# Patient Record
Sex: Male | Born: 1955 | Race: White | Hispanic: No | Marital: Single | Smoking: Never smoker
Health system: Southern US, Community
[De-identification: ages and names within clinical notes are randomized; demographics above are authoritative.]

## PROBLEM LIST (undated history)

## (undated) DIAGNOSIS — I10 Essential (primary) hypertension: Secondary | ICD-10-CM

## (undated) DIAGNOSIS — E785 Hyperlipidemia, unspecified: Secondary | ICD-10-CM

---

## 2010-02-01 ENCOUNTER — Ambulatory Visit: Payer: Self-pay | Admitting: Gastroenterology

## 2011-07-18 ENCOUNTER — Ambulatory Visit: Payer: Self-pay | Admitting: Internal Medicine

## 2011-07-24 ENCOUNTER — Ambulatory Visit: Payer: Self-pay | Admitting: Internal Medicine

## 2011-07-24 LAB — CBC CANCER CENTER
Basophil %: 1 %
Comment - H1-Com2: NORMAL
Eosinophil #: 0.7 x10 3/mm (ref 0.0–0.7)
Eosinophil %: 5.4 %
Lymphocyte %: 17.5 %
Lymphocytes: 17 %
MCHC: 33.5 g/dL (ref 32.0–36.0)
Monocyte %: 7.8 %
Monocytes: 10 %
Neutrophil %: 68.3 %
Platelet: 443 x10 3/mm — ABNORMAL HIGH (ref 150–440)
RDW: 13.3 % (ref 11.5–14.5)
WBC: 12 x10 3/mm — ABNORMAL HIGH (ref 3.8–10.6)

## 2011-07-24 LAB — HEPATIC FUNCTION PANEL A (ARMC)
Albumin: 4 g/dL (ref 3.4–5.0)
Alkaline Phosphatase: 59 U/L (ref 50–136)
Bilirubin,Total: 0.4 mg/dL (ref 0.2–1.0)

## 2011-07-24 LAB — CREATININE, SERUM
EGFR (African American): >60
EGFR (Non-African Amer.): >60

## 2011-07-26 ENCOUNTER — Ambulatory Visit: Payer: Self-pay | Admitting: Internal Medicine

## 2011-08-23 ENCOUNTER — Ambulatory Visit: Payer: Self-pay | Admitting: Internal Medicine

## 2011-09-22 ENCOUNTER — Inpatient Hospital Stay (HOSPITAL_BASED_OUTPATIENT_CLINIC_OR_DEPARTMENT_OTHER)
Admission: EM | Admit: 2011-09-22 | Discharge: 2011-09-25 | DRG: 502 | Disposition: A | Discharge status: Rehabilitation Facility | Payer: Worker's Compensation | Attending: Internal Medicine | Admitting: Internal Medicine

## 2011-09-22 ENCOUNTER — Encounter (HOSPITAL_BASED_OUTPATIENT_CLINIC_OR_DEPARTMENT_OTHER): Payer: Self-pay | Admitting: *Deleted

## 2011-09-22 DIAGNOSIS — D72829 Elevated white blood cell count, unspecified: Secondary | ICD-10-CM | POA: Diagnosis present

## 2011-09-22 DIAGNOSIS — E785 Hyperlipidemia, unspecified: Secondary | ICD-10-CM | POA: Diagnosis present

## 2011-09-22 DIAGNOSIS — E119 Type 2 diabetes mellitus without complications: Secondary | ICD-10-CM | POA: Diagnosis present

## 2011-09-22 DIAGNOSIS — E669 Obesity, unspecified: Secondary | ICD-10-CM | POA: Diagnosis present

## 2011-09-22 DIAGNOSIS — I10 Essential (primary) hypertension: Secondary | ICD-10-CM | POA: Diagnosis present

## 2011-09-22 DIAGNOSIS — Y9301 Activity, walking, marching and hiking: Secondary | ICD-10-CM

## 2011-09-22 DIAGNOSIS — S76119A Strain of unspecified quadriceps muscle, fascia and tendon, initial encounter: Secondary | ICD-10-CM

## 2011-09-22 DIAGNOSIS — Z87891 Personal history of nicotine dependence: Secondary | ICD-10-CM

## 2011-09-22 DIAGNOSIS — Z79899 Other long term (current) drug therapy: Secondary | ICD-10-CM

## 2011-09-22 DIAGNOSIS — W108XXA Fall (on) (from) other stairs and steps, initial encounter: Secondary | ICD-10-CM | POA: Diagnosis present

## 2011-09-22 DIAGNOSIS — S838X9A Sprain of other specified parts of unspecified knee, initial encounter: Principal | ICD-10-CM | POA: Diagnosis present

## 2011-09-22 DIAGNOSIS — S86819A Strain of other muscle(s) and tendon(s) at lower leg level, unspecified leg, initial encounter: Principal | ICD-10-CM | POA: Diagnosis present

## 2011-09-22 HISTORY — DX: Essential (primary) hypertension: I10

## 2011-09-22 NOTE — ED Notes (Signed)
Pt states he was going down the steps at work and his right leg gave out. "Felt pop above knee" Tried to catch himself and the same thing happened on the left side. Brought to ED via EMS.

## 2011-09-22 NOTE — ED Notes (Signed)
Pt was at work slipped and fell onto concrete onto both knees  states that he felt both knees pop

## 2011-09-23 ENCOUNTER — Encounter (HOSPITAL_COMMUNITY): Payer: Self-pay | Admitting: *Deleted

## 2011-09-23 ENCOUNTER — Encounter (HOSPITAL_COMMUNITY)
Admission: EM | Disposition: A | Discharge status: Rehabilitation Facility | Payer: Self-pay | Source: Home / Self Care | Attending: Internal Medicine

## 2011-09-23 ENCOUNTER — Emergency Department (INDEPENDENT_AMBULATORY_CARE_PROVIDER_SITE_OTHER): Payer: Worker's Compensation

## 2011-09-23 ENCOUNTER — Encounter (HOSPITAL_COMMUNITY): Payer: Self-pay | Admitting: Certified Registered"

## 2011-09-23 ENCOUNTER — Inpatient Hospital Stay (HOSPITAL_COMMUNITY): Payer: Worker's Compensation | Admitting: Certified Registered"

## 2011-09-23 DIAGNOSIS — R937 Abnormal findings on diagnostic imaging of other parts of musculoskeletal system: Secondary | ICD-10-CM

## 2011-09-23 DIAGNOSIS — S8000XA Contusion of unspecified knee, initial encounter: Secondary | ICD-10-CM

## 2011-09-23 DIAGNOSIS — S99929A Unspecified injury of unspecified foot, initial encounter: Secondary | ICD-10-CM

## 2011-09-23 DIAGNOSIS — E785 Hyperlipidemia, unspecified: Secondary | ICD-10-CM | POA: Diagnosis present

## 2011-09-23 DIAGNOSIS — E119 Type 2 diabetes mellitus without complications: Secondary | ICD-10-CM | POA: Diagnosis present

## 2011-09-23 DIAGNOSIS — W19XXXA Unspecified fall, initial encounter: Secondary | ICD-10-CM

## 2011-09-23 DIAGNOSIS — I10 Essential (primary) hypertension: Secondary | ICD-10-CM | POA: Diagnosis present

## 2011-09-23 DIAGNOSIS — M25569 Pain in unspecified knee: Secondary | ICD-10-CM

## 2011-09-23 DIAGNOSIS — S76119A Strain of unspecified quadriceps muscle, fascia and tendon, initial encounter: Secondary | ICD-10-CM

## 2011-09-23 HISTORY — PX: QUADRICEPS TENDON REPAIR: SHX756

## 2011-09-23 LAB — GLUCOSE, CAPILLARY
Glucose-Capillary: 171 mg/dL — ABNORMAL HIGH (ref 70–99)
Glucose-Capillary: 171 mg/dL — ABNORMAL HIGH (ref 70–99)
Glucose-Capillary: 141 mg/dL — ABNORMAL HIGH (ref 70–99)

## 2011-09-23 LAB — CBC
Hemoglobin: 13.6 g/dL (ref 13.0–17.0)
Hemoglobin: 13.7 g/dL (ref 13.0–17.0)
MCHC: 33.5 g/dL (ref 30.0–36.0)
Platelets: 377 10*3/uL (ref 150–400)
RBC: 4.45 MIL/uL (ref 4.22–5.81)
RBC: 4.52 MIL/uL (ref 4.22–5.81)
WBC: 18.8 10*3/uL — ABNORMAL HIGH (ref 4.0–10.5)
WBC: 12.8 10*3/uL — ABNORMAL HIGH (ref 4.0–10.5)

## 2011-09-23 LAB — BASIC METABOLIC PANEL
Chloride: 100 mEq/L (ref 96–112)
Creatinine, Ser: 0.8 mg/dL (ref 0.50–1.35)
GFR calc Af Amer: >90 mL/min (ref >90)
Potassium: 3.9 mEq/L (ref 3.5–5.1)
Sodium: 137 mEq/L (ref 135–145)

## 2011-09-23 LAB — DIFFERENTIAL
Basophils Relative: 0 % (ref 0–1)
Eosinophils Relative: 1 % (ref 0–5)
Lymphocytes Relative: 12 % (ref 12–46)
Monocytes Relative: 8 % (ref 3–12)
Neutrophils Relative %: 79 % — ABNORMAL HIGH (ref 43–77)

## 2011-09-23 LAB — CREATININE, SERUM
Creatinine, Ser: 0.78 mg/dL (ref 0.50–1.35)
GFR calc Af Amer: >90 mL/min (ref >90)
GFR calc non Af Amer: >90 mL/min (ref >90)

## 2011-09-23 SURGERY — REPAIR, TENDON, QUADRICEPS
Anesthesia: General | Site: Leg Upper | Laterality: Bilateral | Wound class: Clean

## 2011-09-23 MED ORDER — SIMVASTATIN 20 MG PO TABS
20.0000 mg | ORAL_TABLET | Freq: Every day | ORAL | Status: DC
  Administered 2011-09-24: 20 mg via ORAL
  Filled 2011-09-23 (×3): qty 1

## 2011-09-23 MED ORDER — ALBUTEROL SULFATE HFA 108 (90 BASE) MCG/ACT IN AERS
INHALATION_SPRAY | RESPIRATORY_TRACT | Status: DC | PRN
  Administered 2011-09-23: 2 via RESPIRATORY_TRACT

## 2011-09-23 MED ORDER — INSULIN ASPART 100 UNIT/ML ~~LOC~~ SOLN
0.0000 [IU] | SUBCUTANEOUS | Status: DC
  Administered 2011-09-23 – 2011-09-24 (×5): 3 [IU] via SUBCUTANEOUS

## 2011-09-23 MED ORDER — HYDROMORPHONE HCL PF 1 MG/ML IJ SOLN
0.5000 mg | INTRAMUSCULAR | Status: AC | PRN
  Administered 2011-09-23 (×2): 0.5 mg via INTRAVENOUS

## 2011-09-23 MED ORDER — ONDANSETRON HCL 4 MG/2ML IJ SOLN
INTRAMUSCULAR | Status: AC
  Filled 2011-09-23: qty 2

## 2011-09-23 MED ORDER — HYDROMORPHONE HCL PF 1 MG/ML IJ SOLN
INTRAMUSCULAR | Status: AC
  Administered 2011-09-23: 0.5 mg via INTRAVENOUS
  Filled 2011-09-23: qty 1

## 2011-09-23 MED ORDER — MORPHINE SULFATE 4 MG/ML IJ SOLN
4.0000 mg | Freq: Once | INTRAMUSCULAR | Status: AC
  Administered 2011-09-23: 4 mg via INTRAVENOUS

## 2011-09-23 MED ORDER — OXYCODONE-ACETAMINOPHEN 5-325 MG PO TABS: 1.0000 | ORAL_TABLET | ORAL | Status: DC | PRN

## 2011-09-23 MED ORDER — HYDROMORPHONE HCL PF 1 MG/ML IJ SOLN
0.5000 mg | INTRAMUSCULAR | Status: DC | PRN
  Administered 2011-09-23 – 2011-09-24 (×4): 1 mg via INTRAVENOUS
  Filled 2011-09-23 (×5): qty 1

## 2011-09-23 MED ORDER — METHOCARBAMOL 100 MG/ML IJ SOLN
500.0000 mg | Freq: Four times a day (QID) | INTRAVENOUS | Status: DC | PRN
  Filled 2011-09-23: qty 5

## 2011-09-23 MED ORDER — DEXTROSE-NACL 5-0.9 % IV SOLN
INTRAVENOUS | Status: DC
  Administered 2011-09-23: 1000 mL via INTRAVENOUS

## 2011-09-23 MED ORDER — OXYCODONE HCL 5 MG PO TABS
5.0000 mg | ORAL_TABLET | ORAL | Status: DC | PRN
  Administered 2011-09-24 – 2011-09-25 (×3): 5 mg via ORAL
  Filled 2011-09-23 (×3): qty 1

## 2011-09-23 MED ORDER — MORPHINE SULFATE 4 MG/ML IJ SOLN: 0.0500 mg/kg | INTRAMUSCULAR | Status: DC | PRN

## 2011-09-23 MED ORDER — FENTANYL CITRATE 0.05 MG/ML IJ SOLN
INTRAMUSCULAR | Status: DC | PRN
  Administered 2011-09-23: 150 ug via INTRAVENOUS
  Administered 2011-09-23: 50 ug via INTRAVENOUS

## 2011-09-23 MED ORDER — METHOCARBAMOL 500 MG PO TABS
500.0000 mg | ORAL_TABLET | Freq: Four times a day (QID) | ORAL | Status: DC | PRN
  Filled 2011-09-23: qty 1

## 2011-09-23 MED ORDER — CHLORHEXIDINE GLUCONATE 4 % EX LIQD
60.0000 mL | Freq: Once | CUTANEOUS | Status: AC
  Administered 2011-09-23: 4 via TOPICAL
  Filled 2011-09-23: qty 60

## 2011-09-23 MED ORDER — HYDROMORPHONE HCL PF 1 MG/ML IJ SOLN
0.2500 mg | INTRAMUSCULAR | Status: DC | PRN
  Administered 2011-09-23 (×4): 0.5 mg via INTRAVENOUS

## 2011-09-23 MED ORDER — LISINOPRIL-HYDROCHLOROTHIAZIDE 20-25 MG PO TABS: 1.0000 | ORAL_TABLET | Freq: Every day | ORAL | Status: DC

## 2011-09-23 MED ORDER — ONDANSETRON HCL 4 MG/2ML IJ SOLN
INTRAMUSCULAR | Status: DC | PRN
  Administered 2011-09-23: 4 mg via INTRAVENOUS

## 2011-09-23 MED ORDER — LISINOPRIL 10 MG PO TABS
10.0000 mg | ORAL_TABLET | Freq: Two times a day (BID) | ORAL | Status: DC
  Administered 2011-09-23: 10 mg via ORAL
  Filled 2011-09-23 (×2): qty 1

## 2011-09-23 MED ORDER — INSULIN ASPART 100 UNIT/ML ~~LOC~~ SOLN
0.0000 [IU] | SUBCUTANEOUS | Status: DC
  Administered 2011-09-23: 2 [IU] via SUBCUTANEOUS
  Administered 2011-09-23: 3 [IU] via SUBCUTANEOUS

## 2011-09-23 MED ORDER — SUCCINYLCHOLINE CHLORIDE 20 MG/ML IJ SOLN
INTRAMUSCULAR | Status: DC | PRN
  Administered 2011-09-23 (×2): 100 mg via INTRAVENOUS

## 2011-09-23 MED ORDER — METFORMIN HCL 500 MG PO TABS: 1000.0000 mg | ORAL_TABLET | Freq: Two times a day (BID) | ORAL | Status: DC

## 2011-09-23 MED ORDER — HEPARIN SODIUM (PORCINE) 5000 UNIT/ML IJ SOLN
5000.0000 [IU] | Freq: Three times a day (TID) | INTRAMUSCULAR | Status: DC
  Filled 2011-09-23 (×4): qty 1

## 2011-09-23 MED ORDER — SIMVASTATIN 10 MG PO TABS
10.0000 mg | ORAL_TABLET | Freq: Every day | ORAL | Status: DC
Start: 2011-09-23 — End: 2011-09-23
  Administered 2011-09-23: 23:00:00 via ORAL
  Filled 2011-09-23: qty 1

## 2011-09-23 MED ORDER — MORPHINE SULFATE 4 MG/ML IJ SOLN
INTRAMUSCULAR | Status: AC
  Filled 2011-09-23: qty 1

## 2011-09-23 MED ORDER — ONDANSETRON HCL 4 MG/2ML IJ SOLN: 4.0000 mg | Freq: Four times a day (QID) | INTRAMUSCULAR | Status: DC | PRN

## 2011-09-23 MED ORDER — METOCLOPRAMIDE HCL 5 MG/ML IJ SOLN
5.0000 mg | Freq: Three times a day (TID) | INTRAMUSCULAR | Status: DC | PRN
  Filled 2011-09-23: qty 2

## 2011-09-23 MED ORDER — 0.9 % SODIUM CHLORIDE (POUR BTL) OPTIME
TOPICAL | Status: DC | PRN
  Administered 2011-09-23: 1000 mL

## 2011-09-23 MED ORDER — MIDAZOLAM HCL 5 MG/5ML IJ SOLN
INTRAMUSCULAR | Status: DC | PRN
  Administered 2011-09-23: 2 mg via INTRAVENOUS

## 2011-09-23 MED ORDER — LIDOCAINE HCL (CARDIAC) 20 MG/ML IV SOLN
INTRAVENOUS | Status: DC | PRN
  Administered 2011-09-23: 100 mg via INTRAVENOUS

## 2011-09-23 MED ORDER — OXYCODONE-ACETAMINOPHEN 5-325 MG PO TABS
1.0000 | ORAL_TABLET | Freq: Once | ORAL | Status: AC
  Administered 2011-09-23: 1 via ORAL
  Filled 2011-09-23: qty 1

## 2011-09-23 MED ORDER — DOCUSATE SODIUM 100 MG PO CAPS
100.0000 mg | ORAL_CAPSULE | Freq: Two times a day (BID) | ORAL | Status: DC
  Administered 2011-09-23 – 2011-09-25 (×4): 100 mg via ORAL
  Filled 2011-09-23 (×6): qty 1

## 2011-09-23 MED ORDER — HYDROMORPHONE HCL PF 1 MG/ML IJ SOLN
1.0000 mg | INTRAMUSCULAR | Status: DC | PRN
  Administered 2011-09-23 (×3): 1 mg via INTRAVENOUS
  Filled 2011-09-23 (×3): qty 1

## 2011-09-23 MED ORDER — CEFAZOLIN SODIUM-DEXTROSE 2-3 GM-% IV SOLR
2.0000 g | Freq: Four times a day (QID) | INTRAVENOUS | Status: AC
  Administered 2011-09-23 – 2011-09-24 (×3): 2 g via INTRAVENOUS
  Filled 2011-09-23 (×3): qty 50

## 2011-09-23 MED ORDER — LACTATED RINGERS IV SOLN
INTRAVENOUS | Status: DC | PRN
  Administered 2011-09-23: 18:00:00 via INTRAVENOUS

## 2011-09-23 MED ORDER — ONDANSETRON HCL 4 MG/2ML IJ SOLN: 4.0000 mg | Freq: Once | INTRAMUSCULAR | Status: DC | PRN

## 2011-09-23 MED ORDER — ONDANSETRON HCL 4 MG/2ML IJ SOLN
4.0000 mg | Freq: Once | INTRAMUSCULAR | Status: AC
  Administered 2011-09-23: 4 mg via INTRAVENOUS

## 2011-09-23 MED ORDER — SODIUM CHLORIDE 0.9 % IV SOLN
INTRAVENOUS | Status: DC
  Administered 2011-09-23: 23:00:00 via INTRAVENOUS

## 2011-09-23 MED ORDER — HYDROCHLOROTHIAZIDE 25 MG PO TABS
25.0000 mg | ORAL_TABLET | Freq: Every day | ORAL | Status: DC
  Administered 2011-09-24 – 2011-09-25 (×2): 25 mg via ORAL
  Filled 2011-09-23 (×2): qty 1

## 2011-09-23 MED ORDER — LISINOPRIL 20 MG PO TABS
20.0000 mg | ORAL_TABLET | Freq: Every day | ORAL | Status: DC
  Administered 2011-09-24 – 2011-09-25 (×2): 20 mg via ORAL
  Filled 2011-09-23 (×2): qty 1

## 2011-09-23 MED ORDER — CEFAZOLIN SODIUM-DEXTROSE 2-3 GM-% IV SOLR
2.0000 g | INTRAVENOUS | Status: AC
  Administered 2011-09-23: 2 g via INTRAVENOUS
  Filled 2011-09-23: qty 50

## 2011-09-23 MED ORDER — PROPOFOL 10 MG/ML IV EMUL
INTRAVENOUS | Status: DC | PRN
  Administered 2011-09-23: 120 mg via INTRAVENOUS

## 2011-09-23 MED ORDER — METHOCARBAMOL 100 MG/ML IJ SOLN
500.0000 mg | INTRAVENOUS | Status: AC
  Administered 2011-09-23: 500 mg via INTRAVENOUS
  Filled 2011-09-23: qty 5

## 2011-09-23 MED ORDER — METOCLOPRAMIDE HCL 10 MG PO TABS: 5.0000 mg | ORAL_TABLET | Freq: Three times a day (TID) | ORAL | Status: DC | PRN

## 2011-09-23 MED ORDER — GLIMEPIRIDE 4 MG PO TABS
4.0000 mg | ORAL_TABLET | Freq: Two times a day (BID) | ORAL | Status: DC
  Administered 2011-09-24: 4 mg via ORAL
  Filled 2011-09-23 (×3): qty 1

## 2011-09-23 MED ORDER — ONDANSETRON HCL 4 MG PO TABS: 4.0000 mg | ORAL_TABLET | Freq: Four times a day (QID) | ORAL | Status: DC | PRN

## 2011-09-23 MED ORDER — METFORMIN HCL 500 MG PO TABS
1000.0000 mg | ORAL_TABLET | Freq: Every day | ORAL | Status: DC
  Filled 2011-09-23 (×2): qty 2

## 2011-09-23 MED ORDER — HYDROCODONE-ACETAMINOPHEN 5-325 MG PO TABS
1.0000 | ORAL_TABLET | ORAL | Status: DC | PRN
Start: 2011-09-23 — End: 2011-09-25

## 2011-09-23 SURGICAL SUPPLY — 47 items
BANDAGE ELASTIC 4 VELCRO ST LF (GAUZE/BANDAGES/DRESSINGS) ×2 IMPLANT
BANDAGE ELASTIC 6 VELCRO ST LF (GAUZE/BANDAGES/DRESSINGS) ×2 IMPLANT
BANDAGE GAUZE ELAST BULKY 4 IN (GAUZE/BANDAGES/DRESSINGS) ×2 IMPLANT
BLADE SURG 10 STRL SS (BLADE) ×2 IMPLANT
BNDG COHESIVE 4X5 TAN STRL (GAUZE/BANDAGES/DRESSINGS) ×2 IMPLANT
BNDG COHESIVE 6X5 TAN STRL LF (GAUZE/BANDAGES/DRESSINGS) ×2 IMPLANT
CLOTH BEACON ORANGE TIMEOUT ST (SAFETY) ×2 IMPLANT
COVER MAYO STAND STRL (DRAPES) ×2 IMPLANT
CUFF TOURNIQUET SINGLE 24IN (TOURNIQUET CUFF) IMPLANT
CUFF TOURNIQUET SINGLE 34IN LL (TOURNIQUET CUFF) IMPLANT
CUFF TOURNIQUET SINGLE 44IN (TOURNIQUET CUFF) IMPLANT
DRAPE INCISE IOBAN 66X45 STRL (DRAPES) ×2 IMPLANT
DRAPE U-SHAPE 47X51 STRL (DRAPES) ×2 IMPLANT
DRSG ADAPTIC 3X8 NADH LF (GAUZE/BANDAGES/DRESSINGS) ×2 IMPLANT
DRSG PAD ABDOMINAL 8X10 ST (GAUZE/BANDAGES/DRESSINGS) ×2 IMPLANT
DURAPREP 26ML APPLICATOR (WOUND CARE) ×2 IMPLANT
ELECT REM PT RETURN 9FT ADLT (ELECTROSURGICAL) ×2
ELECTRODE REM PT RTRN 9FT ADLT (ELECTROSURGICAL) ×1 IMPLANT
GLOVE BIOGEL PI IND STRL 9 (GLOVE) ×1 IMPLANT
GLOVE BIOGEL PI INDICATOR 9 (GLOVE) ×1
GLOVE SURG ORTHO 9.0 STRL STRW (GLOVE) ×2 IMPLANT
GOWN PREVENTION PLUS XLARGE (GOWN DISPOSABLE) ×2 IMPLANT
KIT BASIN OR (CUSTOM PROCEDURE TRAY) ×2 IMPLANT
KIT ROOM TURNOVER OR (KITS) ×2 IMPLANT
MANIFOLD NEPTUNE II (INSTRUMENTS) ×2 IMPLANT
NEEDLE STRAIGHT KEITH (NEEDLE) IMPLANT
NS IRRIG 1000ML POUR BTL (IV SOLUTION) ×2 IMPLANT
PACK ORTHO EXTREMITY (CUSTOM PROCEDURE TRAY) ×2 IMPLANT
PAD ARMBOARD 7.5X6 YLW CONV (MISCELLANEOUS) ×4 IMPLANT
PAD CAST 4YDX4 CTTN HI CHSV (CAST SUPPLIES) ×1 IMPLANT
PADDING CAST COTTON 4X4 STRL (CAST SUPPLIES) ×1
PADDING CAST COTTON 6X4 STRL (CAST SUPPLIES) ×2 IMPLANT
PASSER SUT SWANSON 36MM LOOP (INSTRUMENTS) ×2 IMPLANT
PENCIL BUTTON HOLSTER BLD 10FT (ELECTRODE) ×2 IMPLANT
SPONGE GAUZE 4X4 12PLY (GAUZE/BANDAGES/DRESSINGS) ×2 IMPLANT
SPONGE LAP 18X18 X RAY DECT (DISPOSABLE) ×4 IMPLANT
STAPLER VISISTAT 35W (STAPLE) ×2 IMPLANT
STOCKINETTE IMPERVIOUS 9X36 MD (GAUZE/BANDAGES/DRESSINGS) ×4 IMPLANT
SUT ETHIBOND 2 OS 4 DA (SUTURE) ×8 IMPLANT
SUT ETHIBOND NAB BRD #0 18IN (SUTURE) ×2 IMPLANT
SUT VIC AB 0 CTB1 27 (SUTURE) ×4 IMPLANT
SUT VIC AB 2-0 CTB1 (SUTURE) ×4 IMPLANT
TOWEL OR 17X24 6PK STRL BLUE (TOWEL DISPOSABLE) ×2 IMPLANT
TOWEL OR 17X26 10 PK STRL BLUE (TOWEL DISPOSABLE) ×2 IMPLANT
TUBE CONNECTING 12X1/4 (SUCTIONS) ×2 IMPLANT
WATER STERILE IRR 1000ML POUR (IV SOLUTION) ×2 IMPLANT
YANKAUER SUCT BULB TIP NO VENT (SUCTIONS) ×2 IMPLANT

## 2011-09-23 NOTE — Progress Notes (Signed)
Patient arrived from [facility] med center via stretcher, oriented to room, call light within reach.  Pain in knees on arrival 5/10.  Knee immobilizers to both knees, band-aid to left bottom of foot, abrasion to bilateral feet from scratching stated patient.  Oxygen via nasal cannula.  Company secretary notified to patients arrival.  Macarthur Critchley, RN

## 2011-09-23 NOTE — Anesthesia Preprocedure Evaluation (Addendum)
Anesthesia Evaluation  Patient identified by MRN, date of birth, ID band Patient awake    Reviewed: Allergy & Precautions, H&P , NPO status , Patient's Chart, lab work & pertinent test results, reviewed documented beta blocker date and time   Airway Mallampati: II TM Distance: >3 FB Neck ROM: Full    Dental  (+) Dental Advisory Given   Pulmonary  breath sounds clear to auscultation        Cardiovascular hypertension, Pt. on medications Rhythm:Regular Rate:Normal     Neuro/Psych    GI/Hepatic   Endo/Other  Diabetes mellitus-, Type 2, Oral Hypoglycemic Agents  Renal/GU      Musculoskeletal   Abdominal   Peds  Hematology   Anesthesia Other Findings   Reproductive/Obstetrics                          Anesthesia Physical Anesthesia Plan  ASA: III and Emergent  Anesthesia Plan: General   Post-op Pain Management:    Induction: Intravenous  Airway Management Planned: Oral ETT  Additional Equipment:   Intra-op Plan:   Post-operative Plan: Extubation in OR  Informed Consent: I have reviewed the patients History and Physical, chart, labs and discussed the procedure including the risks, benefits and alternatives for the proposed anesthesia with the patient or authorized representative who has indicated his/her understanding and acceptance.   Dental advisory given  Plan Discussed with: CRNA, Anesthesiologist and Surgeon  Anesthesia Plan Comments:         Anesthesia Quick Evaluation

## 2011-09-23 NOTE — Anesthesia Procedure Notes (Signed)
Procedure Name: Intubation Date/Time: 09/23/2011 6:27 PM Performed by: Jerilee Hoh Pre-anesthesia Checklist: Patient identified, Emergency Drugs available, Suction available and Patient being monitored Patient Re-evaluated:Patient Re-evaluated prior to inductionOxygen Delivery Method: Circle system utilized Preoxygenation: Pre-oxygenation with 100% oxygen Intubation Type: IV induction Ventilation: Mask ventilation without difficulty and Oral airway inserted - appropriate to patient size Laryngoscope Size: Mac and 4 Grade View: Grade II Tube type: Oral Tube size: 7.5 mm Number of attempts: 1 Airway Equipment and Method: Stylet Placement Confirmation: ETT inserted through vocal cords under direct vision,  positive ETCO2 and breath sounds checked- equal and bilateral Secured at: 23 cm Tube secured with: Tape Dental Injury: Teeth and Oropharynx as per pre-operative assessment

## 2011-09-23 NOTE — Anesthesia Postprocedure Evaluation (Signed)
Anesthesia Post Note  Patient: [name] [name]  Procedure(s) Performed: Procedure(s) (LRB): REPAIR QUADRICEP TENDON (Bilateral)  Anesthesia type: general  Patient location: PACU  Post pain: Pain level controlled  Post assessment: Patient's Cardiovascular Status Stable  Last Vitals:  Filed Vitals:   09/23/11 2000  BP:   Pulse: 92  Temp:   Resp: 11    Post vital signs: Reviewed and stable  Level of consciousness: sedated  Complications: No apparent anesthesia complications

## 2011-09-23 NOTE — Progress Notes (Signed)
09/23/2011 [name] [name] SPARKS Case Management Note [phone]  Utilization review completed.  

## 2011-09-23 NOTE — Transfer of Care (Signed)
Immediate Anesthesia Transfer of Care Note  Patient: [name] [name]  Procedure(s) Performed: Procedure(s) (LRB): REPAIR QUADRICEP TENDON (Bilateral)  Patient Location: PACU  Anesthesia Type: General  Level of Consciousness: awake, alert , oriented and patient cooperative  Airway & Oxygen Therapy: Patient Spontanous Breathing and Patient connected to face mask oxygen  Post-op Assessment: Report given to PACU RN, Post -op Vital signs reviewed and stable and Patient moving all extremities  Post vital signs: Reviewed and stable  Complications: No apparent anesthesia complications

## 2011-09-23 NOTE — ED Notes (Signed)
I placed a call for orthopedic on call per Dr. Ranae Palms, the call was returned by Dr. Lajoyce Corners @ 0145am.

## 2011-09-23 NOTE — Progress Notes (Signed)
Patient ID: [name] [name], male   DOB: 14-Jul-1955, 56 y.o.   MRN: [id number]  Subjective: Patient NPO for possibly surgery.No major complaints  Objective: Weight change:  No intake or output data in the 24 hours ending 09/23/11 0730 Blood pressure 146/83, pulse 102, temperature 98 F (36.7 C), temperature source Oral, resp. rate 18, height 5\' 8"  (1.727 m), weight 117.6 kg (259 lb 4.2 oz), SpO2 98.00%. Filed Vitals:   09/23/11 0409 09/23/11 0441 09/23/11 0503 09/23/11 0557  BP: 125/60 123/68 109/43 146/83  Pulse: 95 97  102  Temp: 98 F (36.7 C)   98 F (36.7 C)  TempSrc: Oral   Oral  Resp: 18   18  Height:   5\' 8"  (1.727 m)   Weight:   117.6 kg (259 lb 4.2 oz)   SpO2: 94% 98% 99% 98%    Physical Exam: General:   Awake, A&O pleasant.  Sister at bedside Lungs:  Clear to ausculatation. 0 w/c/r Cardiovascular:  RRR 0 m/r/g Abdomen: Obese, soft, nt, nd, +bs Extremities: Lower ext.  Both in [facility] Velcro boots.  Feet with no edema, pulses intact. Basic Metabolic Panel:  Lab 09/23/11 4010  NA 137  K 3.9  CL 100  CO2 27  GLUCOSE 166*  BUN 13  CREATININE 0.80  CALCIUM 9.2  MG --  PHOS --   CBC:  Lab 09/23/11 0302  WBC 18.8*  NEUTROABS 14.8*  HGB 13.6  HCT 39.9  MCV 89.7  PLT 377   CBG:  Lab 09/23/11 0545  GLUCAP 171*    Micro Results: No results found for this or any previous visit (from the past 240 hour(s)).  Studies/Results: Scheduled Meds:   . docusate sodium  100 mg Oral BID  . heparin  5,000 Units Subcutaneous Q8H  . insulin aspart  0-9 Units Subcutaneous Q4H  . lisinopril  10 mg Oral BID  . metFORMIN  1,000 mg Oral Q breakfast  .  morphine injection  4 mg Intravenous Once  . ondansetron (ZOFRAN) IV  4 mg Intravenous Once  . oxyCODONE-acetaminophen  1 tablet Oral Once  . simvastatin  10 mg Oral QHS  . DISCONTD: metFORMIN  1,000-1,500 mg Oral BID   Continuous Infusions:   . dextrose 5 % and 0.9% NaCl     PRN Meds:.HYDROmorphone, ondansetron  (ZOFRAN) IV, ondansetron, oxyCODONE  Anti-infectives:  Anti-infectives    None      Assessment/Plan: Principal Problem:  Present on Admission:  Possible bilateral quadriceps tendon ruptures. Dr. Lajoyce Corners consulted .  Patient was made NPO after breakfast.Tendon repair later this evening  Leukocytosis -downtrending -no overt foci of infection apparent, monitor off antibiotics.  Marland KitchenHTN (hypertension) BP stable, on lisinopril  .DM (diabetes mellitus) Holding oral hyperglycemics. On sliding scale moderate with Cbgs q 4 hours.  .Hyperlipidemia  On statin  *DVT Prophylaxis Was on heparin this morning.  Held after 10 am dose today for possible surgery.Will need to resume in am.  Code Status Full Code  Disposition -will need PT to assess after tendon repair-will order in am  Stephani Police 09/23/2011, 7:30 AM [phone]  Attending I have seen and examined the patient, I have reviewed and edited the above documentation, and I  agree with  assessment and plan.  Dr Windell Norfolk MD

## 2011-09-23 NOTE — Consult Note (Signed)
Reason for Consult: quad tendon rupture  Referring Physician: [facility]  [name] [name] is an 56 y.o. male.  ZOX:WRUEAVW had both knees collapse while standing  Past Medical History  Diagnosis Date  . Hypertension   . Diabetes mellitus     History reviewed. No pertinent past surgical history.  History reviewed. No pertinent family history.  Social History:  reports that he has never smoked. He does not have any smokeless tobacco history on file. He reports that he does not drink alcohol or use illicit drugs.  Allergies: No Known Allergies  Medications:reviewed  Results for orders placed during the hospital encounter of 09/22/11 (from the past 48 hour(s))  CBC     Status: Abnormal   Collection Time   09/23/11  3:02 AM      Component Value Range Comment   WBC 18.8 (*) 4.0 - 10.5 (K/uL)    RBC 4.45  4.22 - 5.81 (MIL/uL)    Hemoglobin 13.6  13.0 - 17.0 (g/dL)    HCT 09.8  11.9 - 14.7 (%)    MCV 89.7  78.0 - 100.0 (fL)    MCH 30.6  26.0 - 34.0 (pg)    MCHC 34.1  30.0 - 36.0 (g/dL)    RDW 82.9  56.2 - 13.0 (%)    Platelets 377  150 - 400 (K/uL)   DIFFERENTIAL     Status: Abnormal   Collection Time   09/23/11  3:02 AM      Component Value Range Comment   Neutrophils Relative 79 (*) 43 - 77 (%)    Lymphocytes Relative 12  12 - 46 (%)    Monocytes Relative 8  3 - 12 (%)    Eosinophils Relative 1  0 - 5 (%)    Basophils Relative 0  0 - 1 (%)    Neutro Abs 14.8 (*) 1.7 - 7.7 (K/uL)    Lymphs Abs 2.3  0.7 - 4.0 (K/uL)    Monocytes Absolute 1.5 (*) 0.1 - 1.0 (K/uL)    Eosinophils Absolute 0.2  0.0 - 0.7 (K/uL)    Basophils Absolute 0.0  0.0 - 0.1 (K/uL)    WBC Morphology MILD LEFT SHIFT (1-5% METAS, OCC MYELO, OCC BANDS)   WHITE COUNT CONFIRMED ON SMEAR   Smear Review PLATELET COUNT CONFIRMED BY SMEAR     BASIC METABOLIC PANEL     Status: Abnormal   Collection Time   09/23/11  3:02 AM      Component Value Range Comment   Sodium 137  135 - 145 (mEq/L)    Potassium 3.9  3.5 - 5.1 (mEq/L)     Chloride 100  96 - 112 (mEq/L)    CO2 27  19 - 32 (mEq/L)    Glucose, Bld 166 (*) 70 - 99 (mg/dL)    BUN 13  6 - 23 (mg/dL)    Creatinine, Ser 8.65  0.50 - 1.35 (mg/dL)    Calcium 9.2  8.4 - 10.5 (mg/dL)    GFR calc non Af Amer >90  >90 (mL/min)    GFR calc Af Amer >90  >90 (mL/min)   GLUCOSE, CAPILLARY     Status: Abnormal   Collection Time   09/23/11  5:45 AM      Component Value Range Comment   Glucose-Capillary 171 (*) 70 - 99 (mg/dL)     Dg Knee 2 Views Left  09/23/2011  *RADIOLOGY REPORT*  Clinical Data: Fall down steps after legs gain valve.  Pain and swelling  in the left knee.  LEFT KNEE - 1-2 VIEW  Comparison: None.  Findings: Mild degenerative narrowing of the medial and lateral compartments.  No definite effusion.  There is infiltration and thickening of soft tissues in the suprapatellar region with soft tissue hematoma and small displaced bone fragment.  Changes suggest hematoma and rupture of the quadriceps tendon insertion.  No displaced fracture or subluxation demonstrated in the femur or tibia.  No focal bone lesion or bone destruction.  IMPRESSION: Soft tissue thickening and hematoma with focal calcification or bone fragment in the suprapatellar space suggesting hematoma and rupture of the quadriceps tendon insertion.  Original Report Authenticated By: Marlon Pel, M.D.   Dg Knee 2 Views Right  09/23/2011  *RADIOLOGY REPORT*  Clinical Data: Injured right knee.  RIGHT KNEE - 1-2 VIEW  Comparison: None  Findings: The joint spaces are fairly well maintained.  There are mild tricompartmental degenerative changes.  No acute fracture or osteochondral abnormality.  Findings suspicious for a quadriceps tendon rupture with edema and fluid surrounding the upper anterior aspect of the knee.  A joint effusion is noted.  IMPRESSION:  1.  No acute bony findings. 2.  Suspect ruptured quadriceps tendon.  Original Report Authenticated By: P. Loralie Champagne, M.D.    Review of Systems    All other systems reviewed and are negative.   Blood pressure 146/83, pulse 102, temperature 98 F (36.7 C), temperature source Oral, resp. rate 18, height 5\' 8"  (1.727 m), weight 117.6 kg (259 lb 4.2 oz), SpO2 98.00%. Physical Exam Extensor lag both knees Assessment/Plan:  Assessment: bilateral quad tendon rupture Plan: repair bilateral quad tendons  [name] [name] 09/23/2011, 9:33 AM

## 2011-09-23 NOTE — H&P (Signed)
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 He felt a pop on his right knee first, and subsequently, felt another pop in his left knee. He has pain proximal to both knees now. Evaluation in the emergency room included plain films of both the right and left knees, with soft tissue swelling suggestive of quadricep ruptures bilaterally. He also was noted to have elevated white count with differential having mild left shift. His hemoglobin is normal. He has normal electrolytes and renal function tests. His blood glucose is 177 g per decaliter. Orthopedic physician was consulted by emergency room physician, and hospitalist was asked to admit patient.  Rewiew of Systems:  The patient denies anorexia, fever, weight loss,, vision loss, decreased hearing, hoarseness, chest pain, syncope, dyspnea on exertion, peripheral edema, balance deficits, hemoptysis, abdominal pain, melena, hematochezia, severe indigestion/heartburn, hematuria, incontinence, genital sores, muscle weakness, suspicious skin lesions, transient blindness, difficulty walking, depression, unusual weight change, abnormal bleeding, enlarged lymph nodes, angioedema, and breast masses.  Past Medical History  Diagnosis Date  . Hypertension   . Diabetes mellitus     History reviewed. No pertinent past surgical history.  Medications:  HOME MEDS: Prior to Admission medications   Medication Sig Start Date End Date Taking? Authorizing Provider  GLIMEPIRIDE PO Take 1 tablet by mouth 2 (two) times daily.    Yes Historical Provider, MD  lisinopril (PRINIVIL,ZESTRIL) 20 MG tablet Take 10 mg by mouth 2 (two) times daily.   Yes Historical Provider, MD  metFORMIN (GLUCOPHAGE) 1000 MG tablet Take 1,000-1,500 mg by mouth 2 (two) times daily. 1000 mg in the evening and 1500 mg at night   Yes  Historical Provider, MD  simvastatin (ZOCOR) 10 MG tablet Take 10 mg by mouth at bedtime.   Yes Historical Provider, MD     Allergies:  No Known Allergies  Social History:   reports that he has never smoked. He does not have any smokeless tobacco history on file. He reports that he does not drink alcohol or use illicit drugs.  Family History: History reviewed. No pertinent family history.   Physical Exam: Filed Vitals:   09/23/11 0409 09/23/11 0441 09/23/11 0503 09/23/11 0557  BP: 125/60 123/68 109/43 146/83  Pulse: 95 97  102  Temp: 98 F (36.7 C)   98 F (36.7 C)  TempSrc: Oral   Oral  Resp: 18   18  Height:   5\' 8"  (1.727 m)   Weight:   117.6 kg (259 lb 4.2 oz)   SpO2: 94% 98% 99% 98%   Blood pressure 146/83, pulse 102, temperature 98 F (36.7 C), temperature source Oral, resp. rate 18, height 5\' 8"  (1.727 m), weight 117.6 kg (259 lb 4.2 oz), SpO2 98.00%.  GEN:  Pleasant person lying in the bed in no acute distress; cooperative with exam PSYCH:  alert and oriented x4; does not appear anxious or depressed; affect is appropriate. HEENT: Mucous membranes pink and anicteric; PERRLA; EOM intact; no cervical lymphadenopathy nor thyromegaly or carotid bruit; no JVD; Breasts:: Not examined CHEST WALL: No tenderness CHEST: Normal respiration, clear to auscultation bilaterally HEART: Regular rate and rhythm; no murmurs rubs or gallops BACK: No kyphosis or scoliosis; no CVA tenderness ABDOMEN: Obese, soft non-tender; no masses, no organomegaly, normal abdominal bowel sounds; no pannus; no intertriginous candida. Rectal Exam: Not done EXTREMITIES:  No bone or joint deformity; age-appropriate arthropathy of the hands and knees; no edema; no ulcerations. Both legs were in the splint. Genitalia: not examined PULSES: 2+ and symmetric SKIN: Normal hydration no rash or ulceration CNS: Cranial nerves 2-12 grossly intact no focal lateralizing neurologic deficit   Labs & Imaging Results  for orders placed during the hospital encounter of 09/22/11 (from the past 48 hour(s))  CBC     Status: Abnormal   Collection Time   09/23/11  3:02 AM      Component Value Range Comment   WBC 18.8 (*) 4.0 - 10.5 (K/uL)    RBC 4.45  4.22 - 5.81 (MIL/uL)    Hemoglobin 13.6  13.0 - 17.0 (g/dL)    HCT 91.4  78.2 - 95.6 (%)    MCV 89.7  78.0 - 100.0 (fL)    MCH 30.6  26.0 - 34.0 (pg)    MCHC 34.1  30.0 - 36.0 (g/dL)    RDW 21.3  08.6 - 57.8 (%)    Platelets 377  150 - 400 (K/uL)   DIFFERENTIAL     Status: Abnormal   Collection Time   09/23/11  3:02 AM      Component Value Range Comment   Neutrophils Relative 79 (*) 43 - 77 (%)    Lymphocytes Relative 12  12 - 46 (%)    Monocytes Relative 8  3 - 12 (%)    Eosinophils Relative 1  0 - 5 (%)    Basophils Relative 0  0 - 1 (%)    Neutro Abs 14.8 (*) 1.7 - 7.7 (K/uL)    Lymphs Abs 2.3  0.7 - 4.0 (K/uL)    Monocytes Absolute 1.5 (*) 0.1 - 1.0 (K/uL)    Eosinophils Absolute 0.2  0.0 - 0.7 (K/uL)    Basophils Absolute 0.0  0.0 - 0.1 (K/uL)    WBC Morphology MILD LEFT SHIFT (1-5% METAS, OCC MYELO, OCC BANDS)   WHITE COUNT CONFIRMED ON SMEAR   Smear Review PLATELET COUNT CONFIRMED BY SMEAR     BASIC METABOLIC PANEL     Status: Abnormal   Collection Time   09/23/11  3:02 AM      Component Value Range Comment   Sodium 137  135 - 145 (mEq/L)    Potassium 3.9  3.5 - 5.1 (mEq/L)    Chloride 100  96 - 112 (mEq/L)    CO2 27  19 - 32 (mEq/L)    Glucose, Bld 166 (*) 70 - 99 (mg/dL)    BUN 13  6 - 23 (mg/dL)    Creatinine, Ser 4.69  0.50 - 1.35 (mg/dL)    Calcium 9.2  8.4 - 10.5 (mg/dL)    GFR calc non Af Amer >90  >90 (mL/min)    GFR calc Af Amer >90  >90 (mL/min)   GLUCOSE, CAPILLARY     Status: Abnormal   Collection Time   09/23/11  5:45 AM      Component Value Range Comment   Glucose-Capillary 171 (*) 70 - 99 (mg/dL)    Dg Knee 2 Views Left  09/23/2011  *RADIOLOGY REPORT*  Clinical Data: Fall down steps after legs gain valve.  Pain and swelling  in the left knee.  LEFT KNEE - 1-2 VIEW  Comparison: None.  Findings: Mild degenerative narrowing of the medial and lateral compartments.  No definite effusion.  There is infiltration and thickening of soft tissues in the suprapatellar region with soft tissue hematoma and small  displaced bone fragment.  Changes suggest hematoma and rupture of the quadriceps tendon insertion.  No displaced fracture or subluxation demonstrated in the femur or tibia.  No focal bone lesion or bone destruction.  IMPRESSION: Soft tissue thickening and hematoma with focal calcification or bone fragment in the suprapatellar space suggesting hematoma and rupture of the quadriceps tendon insertion.  Original Report Authenticated By: Marlon Pel, M.D.   Dg Knee 2 Views Right  09/23/2011  *RADIOLOGY REPORT*  Clinical Data: Injured right knee.  RIGHT KNEE - 1-2 VIEW  Comparison: None  Findings: The joint spaces are fairly well maintained.  There are mild tricompartmental degenerative changes.  No acute fracture or osteochondral abnormality.  Findings suspicious for a quadriceps tendon rupture with edema and fluid surrounding the upper anterior aspect of the knee.  A joint effusion is noted.  IMPRESSION:  1.  No acute bony findings. 2.  Suspect ruptured quadriceps tendon.  Original Report Authenticated By: P. Loralie Champagne, M.D.      Assessment Present on Admission:  .HTN (hypertension) .DM (diabetes mellitus) .Hyperlipidemia Abnormal CBC Possible bilateral quadriceps tendon ruptures  PLAN: Will admit him and placed on n.p.o. in case he needs surgery. He will be given IV with dextrose, and sensitive insulin sliding scale. I will continue his metformin, but will stop his Amaryl. He will be given pain medication along with his antihypertensive medication. He does have abnormal elevated white count, with a left shift. Please consult hematology for further recommendations. Because of the immobilization, I have given him  subcutaneous heparin. If he requires surgery, he is clear for surgery as well. He is stable, full code, and will be admitted to [facility] hospitalist service.   Other plans as per orders.    [name] [name] 09/23/2011, 6:38 AM

## 2011-09-23 NOTE — Op Note (Signed)
OPERATIVE REPORT  DATE OF SURGERY: 09/23/2011  PATIENT:  [name] [name],  56 y.o. male  PRE-OPERATIVE DIAGNOSIS:  Bilateral Quad Tendon Rupture  POST-OPERATIVE DIAGNOSIS:  Bilateral Quad Tendon Rupture  PROCEDURE:  Procedure(s): REPAIR QUADRICEP TENDON Bilateral  SURGEON:  Surgeon(s): Nadara Mustard, MD  ANESTHESIA:   general  EBL:  Minimal ML  SPECIMEN:  No Specimen  TOURNIQUET:  * No tourniquets in log *  PROCEDURE DETAILS: Patient is a 56 year old gentleman who was walking and sustained a bilateral quad tendon rupture. Patient sustained no other injuries. Patient was admitted and medically stabilized and presents at this time for reconstruction of the quad tendon. Risks and benefits were discussed including infection neurovascular injury pain weakness rerupture DVT need for additional surgery. Patient states he understands and wished to proceed at this time. Description of procedure patient brought to OR room 5 and underwent a general anesthetic. After adequate levels of anesthesia were obtained patient's bilateral lower extremities were prepped using DuraPrep and draped into a sterile field. A midline incision was made over the quad tendon. This was carried down to the joint there was a large hematoma within the suprapatellar pouch. This was irrigated and cleansed. Using #2 fiber wire a Krakw suture technique was used to relieve the suture proximal to distal with 4 strands exiting the end of the quad tendon. 3 drill holes were then made through the patella. A suture passer was used to pass the sutures through the patella tendon drill holes and the sutures were tied over the distal pole of the patella with the knee in extension. There was good tension on the quad tendon. Again the wound was irrigated the subcutaneous is closed using 2-0 Vicryl the skin was closed using approximate staples. Attention was then focused on the right quad tendon. A midline incision was made over the quad tendon  this was carried down the hematoma in the suprapatellar pouch was evacuated the joint was cleansed. Using Krakw suture technique #2 FiberWire sutures were placed through the quad tendon with 4 strands exiting the distal aspect of the quad tendon. 3 drill holes were placed longitudinally through the patella and using the suture passer these were passed through the patella and the sutures weren't so on the distal pole the patella looked good tension on the quad tendon. The wound is again irrigated with normal saline 2-0 Vicryl was used to close the subcutaneous and approximate staples were used on the skin. Both wounds were covered with Adaptic orthopedic sponges AB dressing Kerlix and Coban. Both legs were placed in knee immobilizer he was extubated and taken to the PACU in stable condition plan for discharge to home once he is safe for ambulation.  PLAN OF CARE: Admit to inpatient   PATIENT DISPOSITION:  PACU - hemodynamically stable.   Nadara Mustard, MD 09/23/2011 7:17 PM

## 2011-09-23 NOTE — Preoperative (Signed)
Beta Blockers   Reason not to administer Beta Blockers:Not Applicable 

## 2011-09-23 NOTE — ED Provider Notes (Signed)
History     CSN: [id number]  Arrival date & time 09/22/11  2131   First MD Initiated Contact with Patient 09/22/11 2344      Chief Complaint  Patient presents with  . Fall  . bilat knee pain     (Consider location/radiation/quality/duration/timing/severity/associated sxs/prior treatment) HPI Pt slipped while going down stairs and heard pop in R knee. When he tried to catch himself states he heard the same pop in his L knee. Has had bl knee swelling since and not able to ambulate due to pain. Pt states pain is worse in distal anterior thigh. No head or neck injury. No weakness or sensory changes.  Past Medical History  Diagnosis Date  . Hypertension   . Diabetes mellitus     History reviewed. No pertinent past surgical history.  History reviewed. No pertinent family history.  History  Substance Use Topics  . Smoking status: Never Smoker   . Smokeless tobacco: Not on file  . Alcohol Use: No      Review of Systems  Constitutional: Negative for fever and chills.  HENT: Negative for neck pain.   Musculoskeletal: Positive for myalgias, arthralgias and gait problem.  Skin: Negative for wound.  Neurological: Negative for weakness and numbness.    Allergies  Review of patient's allergies indicates no known allergies.  Home Medications   Current Outpatient Rx  Name Route Sig Dispense Refill  . GLIMEPIRIDE PO Oral Take 1 tablet by mouth 2 (two) times daily.     Marland Kitchen LISINOPRIL 20 MG PO TABS Oral Take 10 mg by mouth 2 (two) times daily.    Marland Kitchen METFORMIN HCL 1000 MG PO TABS Oral Take 1,000-1,500 mg by mouth 2 (two) times daily. 1000 mg in the evening and 1500 mg at night    . SIMVASTATIN 10 MG PO TABS Oral Take 10 mg by mouth at bedtime.      BP 125/60  Pulse 95  Temp(Src) 98 F (36.7 C) (Oral)  Resp 18  Ht 5\' 8"  (1.727 m)  Wt 260 lb (117.935 kg)  BMI 39.53 kg/m2  SpO2 94%  Physical Exam  Nursing note and vitals reviewed. Constitutional: He is oriented to person,  place, and time. He appears well-developed and well-nourished. No distress.  HENT:  Head: Normocephalic and atraumatic.  Mouth/Throat: Oropharynx is clear and moist.  Eyes: EOM are normal. Pupils are equal, round, and reactive to light.  Neck: Normal range of motion. Neck supple.  Cardiovascular: Normal rate and regular rhythm.   Pulmonary/Chest: Effort normal and breath sounds normal. No respiratory distress. He has no wheezes. He has no rales.  Abdominal: Soft. Bowel sounds are normal.  Musculoskeletal: Normal range of motion. He exhibits edema. He exhibits no tenderness.       Pt with bl knee effusions R>L. FROM. No joint instablility. TTP over distal quad muscles bl. No masses or obvious injury. 2+ DP pulses bl. No calf pain or swelling  Neurological: He is alert and oriented to person, place, and time.  Skin: Skin is warm and dry. No rash noted. No erythema.  Psychiatric: He has a normal mood and affect. His behavior is normal.    ED Course  Procedures (including critical care time)  Labs Reviewed  CBC - Abnormal; Notable for the following:    WBC 18.8 (*)    All other components within normal limits  DIFFERENTIAL - Abnormal; Notable for the following:    Neutrophils Relative 79 (*)    Neutro Abs  14.8 (*)    Monocytes Absolute 1.5 (*)    All other components within normal limits  BASIC METABOLIC PANEL - Abnormal; Notable for the following:    Glucose, Bld 166 (*)    All other components within normal limits   Dg Knee 2 Views Left  09/23/2011  *RADIOLOGY REPORT*  Clinical Data: Fall down steps after legs gain valve.  Pain and swelling in the left knee.  LEFT KNEE - 1-2 VIEW  Comparison: None.  Findings: Mild degenerative narrowing of the medial and lateral compartments.  No definite effusion.  There is infiltration and thickening of soft tissues in the suprapatellar region with soft tissue hematoma and small displaced bone fragment.  Changes suggest hematoma and rupture of the  quadriceps tendon insertion.  No displaced fracture or subluxation demonstrated in the femur or tibia.  No focal bone lesion or bone destruction.  IMPRESSION: Soft tissue thickening and hematoma with focal calcification or bone fragment in the suprapatellar space suggesting hematoma and rupture of the quadriceps tendon insertion.  Original Report Authenticated By: Marlon Pel, M.D.   Dg Knee 2 Views Right  09/23/2011  *RADIOLOGY REPORT*  Clinical Data: Injured right knee.  RIGHT KNEE - 1-2 VIEW  Comparison: None  Findings: The joint spaces are fairly well maintained.  There are mild tricompartmental degenerative changes.  No acute fracture or osteochondral abnormality.  Findings suspicious for a quadriceps tendon rupture with edema and fluid surrounding the upper anterior aspect of the knee.  A joint effusion is noted.  IMPRESSION:  1.  No acute bony findings. 2.  Suspect ruptured quadriceps tendon.  Original Report Authenticated By: P. Loralie Champagne, M.D.     1. Quadriceps tendon rupture       MDM  Discussed with Dr Lajoyce Corners. Will see in consult. [facility] to admit  Pt resting comfortably. Dr Lovell Sheehan accepted pt for transfer        Loren Racer, MD 09/23/11 [phone]

## 2011-09-24 ENCOUNTER — Encounter (HOSPITAL_COMMUNITY): Payer: Self-pay | Admitting: Physical Medicine and Rehabilitation

## 2011-09-24 DIAGNOSIS — E669 Obesity, unspecified: Secondary | ICD-10-CM

## 2011-09-24 DIAGNOSIS — M66259 Spontaneous rupture of extensor tendons, unspecified thigh: Secondary | ICD-10-CM

## 2011-09-24 LAB — CBC
HCT: 39.9 % (ref 39.0–52.0)
Hemoglobin: 13 g/dL (ref 13.0–17.0)
RDW: 12.7 % (ref 11.5–15.5)
WBC: 15.7 10*3/uL — ABNORMAL HIGH (ref 4.0–10.5)

## 2011-09-24 LAB — BASIC METABOLIC PANEL
BUN: 14 mg/dL (ref 6–23)
Chloride: 99 mEq/L (ref 96–112)
GFR calc Af Amer: >90 mL/min (ref >90)
Glucose, Bld: 189 mg/dL — ABNORMAL HIGH (ref 70–99)
Potassium: 4 mEq/L (ref 3.5–5.1)

## 2011-09-24 LAB — GLUCOSE, CAPILLARY
Glucose-Capillary: 167 mg/dL — ABNORMAL HIGH (ref 70–99)
Glucose-Capillary: 183 mg/dL — ABNORMAL HIGH (ref 70–99)
Glucose-Capillary: 190 mg/dL — ABNORMAL HIGH (ref 70–99)
Glucose-Capillary: 191 mg/dL — ABNORMAL HIGH (ref 70–99)

## 2011-09-24 MED ORDER — HYDROMORPHONE HCL PF 1 MG/ML IJ SOLN
0.5000 mg | INTRAMUSCULAR | Status: DC | PRN
  Administered 2011-09-24 – 2011-09-25 (×6): 0.5 mg via INTRAVENOUS
  Filled 2011-09-24 (×5): qty 1

## 2011-09-24 MED ORDER — INSULIN ASPART 100 UNIT/ML ~~LOC~~ SOLN
4.0000 [IU] | Freq: Three times a day (TID) | SUBCUTANEOUS | Status: DC
  Administered 2011-09-24 – 2011-09-25 (×3): 4 [IU] via SUBCUTANEOUS

## 2011-09-24 MED ORDER — INSULIN ASPART 100 UNIT/ML ~~LOC~~ SOLN: 0.0000 [IU] | Freq: Every day | SUBCUTANEOUS | Status: DC

## 2011-09-24 MED ORDER — INSULIN ASPART 100 UNIT/ML ~~LOC~~ SOLN
0.0000 [IU] | Freq: Three times a day (TID) | SUBCUTANEOUS | Status: DC
  Administered 2011-09-24 – 2011-09-25 (×3): 3 [IU] via SUBCUTANEOUS

## 2011-09-24 NOTE — Evaluation (Addendum)
Occupational Therapy Evaluation Patient Details Name: [name] [name] MRN: [id number] DOB: [birth year] Today's Date: 09/24/2011  Problem List:  Patient Active Problem List  Diagnoses  . Quadriceps tendon rupture  . HTN (hypertension)  . DM (diabetes mellitus)  . Hyperlipidemia    Past Medical History:  Past Medical History  Diagnosis Date  . Hypertension   . Diabetes mellitus    Past Surgical History: History reviewed. No pertinent past surgical history.  OT Assessment/Plan/Recommendation OT Assessment Clinical Impression Statement: 56 yr old male admitted with bilateral quad tendon tears.  Presents with decreased overall independence with basic selfcare tasks and functional transfers.  Feel he will benefit from acute OT services to begin addressing compensatory strategies and selfcare independence.  Feel pt will benefit from follow-up inpatient rehab to reach modified independent level for discharge to his sisters house with PRN supervision. OT Recommendation/Assessment: Patient will need skilled OT in the acute care venue OT Problem List: Decreased strength;Decreased range of motion;Decreased activity tolerance;Impaired balance (sitting and/or standing);Pain;Decreased knowledge of use of DME or AE OT Therapy Diagnosis : Generalized weakness;Acute pain OT Plan OT Frequency: Min 2X/week OT Treatment/Interventions: Self-care/ADL training;DME and/or AE instruction;Balance training;Patient/family education;Therapeutic activities OT Recommendation Follow Up Recommendations: Inpatient Rehab Equipment Recommended: 3 in 1 bedside comode Individuals Consulted Consulted and Agree with Results and Recommendations: Patient OT Goals Acute Rehab OT Goals OT Goal Formulation: With patient Time For Goal Achievement: 7 days ADL Goals Pt Will Perform Grooming: with supervision;Standing at sink ADL Goal: Grooming - Progress: Goal set today Pt Will Perform Lower Body Bathing: with min assist;Sit to  stand from bed;with adaptive equipment ADL Goal: Lower Body Bathing - Progress: Goal set today Pt Will Perform Lower Body Dressing: with min assist;Sit to stand from bed ADL Goal: Lower Body Dressing - Progress: Goal set today Pt Will Transfer to Toilet: with min assist;3-in-1;with DME ADL Goal: Toilet Transfer - Progress: Goal set today Pt Will Perform Toileting - Clothing Manipulation: with min assist;Sitting on 3-in-1 or toilet;Standing ADL Goal: Toileting - Clothing Manipulation - Progress: Goal set today Pt Will Perform Toileting - Hygiene: with min assist;Sit to stand from 3-in-1/toilet ADL Goal: Toileting - Hygiene - Progress: Goal set today  OT Evaluation Precautions/Restrictions  Precautions Required Braces or Orthoses: Yes Knee Immobilizer: On at all times Restrictions Weight Bearing Restrictions: No RLE Weight Bearing: Weight bearing as tolerated LLE Weight Bearing: Weight bearing as tolerated Prior Functioning Home Living Lives With: Alone Type of Home: Other (Comment) (condo) Home Layout: One level Home Access: Level entry Bathroom Shower/Tub: Health visitor: Standard Bathroom Accessibility: Yes How Accessible: Accessible via walker Home Adaptive Equipment: None;Bedside commode/3-in-1;Walker - rolling Additional Comments: Equipment is his brother-in-laws Prior Function Level of Independence: Independent with basic ADLs;Independent with transfers;Independent with homemaking with ambulation;Independent with gait Driving: Yes Vocation: Full time employment ADL ADL Eating/Feeding: Simulated;Independent Where Assessed - Eating/Feeding: Edge of bed Grooming: Simulated;Set up Where Assessed - Grooming: Sitting, bed Upper Body Bathing: Simulated;Set up Where Assessed - Upper Body Bathing: Unsupported;Sitting, bed Lower Body Bathing: Simulated;Maximal assistance Where Assessed - Lower Body Bathing: Sit to stand from bed Upper Body Dressing:  Simulated;Set up Where Assessed - Upper Body Dressing: Sitting, bed Lower Body Dressing: Simulated;Maximal assistance Where Assessed - Lower Body Dressing: Sit to stand from bed Toilet Transfer: Simulated;Moderate assistance Toilet Transfer Method: Proofreader: Other (comment) (simulated EOB.  Pt declined need to toilet.) Toileting - Clothing Manipulation: Simulated;Moderate assistance Where Assessed - Toileting Clothing Manipulation: Sit to stand  from 3-in-1 or toilet Toileting - Hygiene: Simulated;Moderate assistance Where Assessed - Toileting Hygiene: Sit to stand from 3-in-1 or toilet Tub/Shower Transfer: Not assessed Tub/Shower Transfer Method: Not assessed Equipment Used: Rolling walker Ambulation Related to ADLs: Pt min assist for taking small steps using the RW. ADL Comments: Pt will need education on AE for LB selfcare secondary to not having enough flexibilty in his trunk to reach his feet for LB selfcare.  Exhibits moderate difficulty with sit to stand which is to be expected based on the fact that he has 2 KIs. Vision/Perception  Vision - History Baseline Vision: Wears glasses all the time Patient Visual Report: No change from baseline Vision - Assessment Eye Alignment: Within Functional Limits Perception Perception: Within Functional Limits Praxis Praxis: Intact Cognition Cognition Arousal/Alertness: Awake/alert Overall Cognitive Status: Appears within functional limits for tasks assessed Orientation Level: Oriented X4 Sensation/Coordination Sensation Light Touch: Appears Intact Stereognosis: Not tested Hot/Cold: Not tested Proprioception: Not tested Coordination Gross Motor Movements are Fluid and Coordinated: Yes Fine Motor Movements are Fluid and Coordinated: Yes Extremity Assessment RUE Assessment RUE Assessment: Within Functional Limits LUE Assessment LUE Assessment: Within Functional Limits Mobility  Bed Mobility Bed Mobility:  Yes Supine to Sit: 1: +1 Total assist;HOB elevated (Comment degrees) Supine to Sit Details (indicate cue type and reason): HOB 25 degrees Sitting - Scoot to Edge of Bed: 2: Max assist Sitting - Scoot to [place] of Bed Details (indicate cue type and reason): assist to support legs Sit to Supine: 2: Max assist Sit to Supine - Details (indicate cue type and reason): To help lift LEs up into the bed. Scooting to [facility]: 5: Supervision;With rail;Other (comment) (bed in trendelenburg) Transfers Transfers: Yes Sit to Stand: 3: Mod assist;Other (comment);From bed;From elevated surface Sit to Stand Details (indicate cue type and reason): Bed raised to help increase sit to stand. Stand to Sit: 3: Mod assist;Without upper extremity assist Stand to Sit Details: Needs assist to slide LEs forward to sit.  End of Session OT - End of Session Equipment Utilized During Treatment: Gait belt;Left knee immobilizer;Right knee immobilizer Activity Tolerance: Patient limited by pain Patient left: in bed;with family/visitor present;with call bell in reach General Behavior During Session: [facility] for tasks performed Cognition: [facility] for tasks performed   Ayala Ribble OTR/L 09/24/2011, 3:49 PM  Pager number [phone]

## 2011-09-24 NOTE — Progress Notes (Signed)
Patient ID: [name] [name], male   DOB: 1956-02-14, 56 y.o.   MRN: [id number] PCP:  No primary provider on file.  Consultation:  Orthopedic Surgery, Dr. Jonathon Bellows  Procedure:  Bilateral Quadricept Tendon Rupture Repair 09/23/11.  Subjective: Patient post op.  Having pain in legs this am.  Objective: Weight change:   Intake/Output Summary (Last 24 hours) at 09/24/11 1416 Last data filed at 09/24/11 0133  Gross per 24 hour  Intake   1000 ml  Output    375 ml  Net    625 ml   Blood pressure 155/87, pulse 97, temperature 97.7 F (36.5 C), temperature source Oral, resp. rate 18, height 5\' 8"  (1.727 m), weight 117.6 kg (259 lb 4.2 oz), SpO2 94.00%. Filed Vitals:   09/23/11 2218 09/23/11 2230 09/24/11 0728 09/24/11 0955  BP:  149/88 136/85 155/87  Pulse:  93 97   Temp:  98 F (36.7 C) 97.7 F (36.5 C)   TempSrc:  Oral Oral   Resp:  16 18   Height:      Weight:      SpO2: 94% 94%      Physical Exam: General:   Awake, A&O, NAD Lungs:  Clear to ausculatation. 0 w/c/r Cardiovascular:  RRR 0 m/r/g Abdomen: Obese, soft, nt, nd, +bs Extremities: Lower ext.  Bilateral bandages clean and dry..  Feet with no edema, pulses intact.  Basic Metabolic Panel:  Lab 09/24/11 4098 09/23/11 0941 09/23/11 0302  NA 137 -- 137  K 4.0 -- 3.9  CL 99 -- 100  CO2 29 -- 27  GLUCOSE 189* -- 166*  BUN 14 -- 13  CREATININE 0.77 0.78 --  CALCIUM 8.6 -- 9.2  MG -- -- --  PHOS -- -- --   CBC:  Lab 09/24/11 0542 09/23/11 0941 09/23/11 0302  WBC 15.7* 12.8* --  NEUTROABS -- -- 14.8*  HGB 13.0 13.7 --  HCT 39.9 40.9 --  MCV 92.8 90.5 --  PLT 360 381 --   CBG:  Lab 09/24/11 0742 09/24/11 0427 09/24/11 0055 09/23/11 2229 09/23/11 1929 09/23/11 1546  GLUCAP 176* 178* 167* 183* 141* 171*    Micro Results: Recent Results (from the past 240 hour(s))  SURGICAL PCR SCREEN     Status: Normal   Collection Time   09/23/11 12:40 PM      Component Value Range Status Comment   MRSA, PCR NEGATIVE  NEGATIVE   Final    Staphylococcus aureus NEGATIVE  NEGATIVE  Final     Studies/Results: Scheduled Meds:    .  ceFAZolin (ANCEF) IV  2 g Intravenous 60 min Pre-Op  .  ceFAZolin (ANCEF) IV  2 g Intravenous Q6H  . chlorhexidine  60 mL Topical Once  . docusate sodium  100 mg Oral BID  . glimepiride  4 mg Oral BID WC  . lisinopril  20 mg Oral Daily   And  . hydrochlorothiazide  25 mg Oral Daily  . insulin aspart  0-15 Units Subcutaneous Q4H  . methocarbamol (ROBAXIN) IV  500 mg Intravenous To PACU  . simvastatin  20 mg Oral q1800  . DISCONTD: heparin  5,000 Units Subcutaneous Q8H  . DISCONTD: insulin aspart  0-9 Units Subcutaneous Q4H  . DISCONTD: lisinopril  10 mg Oral BID  . DISCONTD: lisinopril-hydrochlorothiazide  1 tablet Oral Daily  . DISCONTD: simvastatin  10 mg Oral QHS   Continuous Infusions:    . sodium chloride 20 mL/hr at 09/23/11 2311  . DISCONTD: dextrose 5 %  and 0.9% NaCl 1,000 mL (09/23/11 0903)   PRN Meds:.HYDROcodone-acetaminophen, HYDROmorphone (DILAUDID) injection, HYDROmorphone, methocarbamol (ROBAXIN) IV, methocarbamol, metoCLOPramide (REGLAN) injection, metoCLOPramide, ondansetron (ZOFRAN) IV, ondansetron, oxyCODONE, oxyCODONE-acetaminophen, DISCONTD: 0.9 % irrigation (POUR BTL), DISCONTD: HYDROmorphone, DISCONTD: HYDROmorphone, DISCONTD: HYDROmorphone, DISCONTD: morphine, DISCONTD: ondansetron (ZOFRAN) IV, DISCONTD: ondansetron (ZOFRAN) IV DISCONTD: ondansetron  Anti-infectives:  Anti-infectives     Start     Dose/Rate Route Frequency Ordered Stop   09/24/11 0030   ceFAZolin (ANCEF) IVPB 2 g/50 mL premix        2 g 100 mL/hr over 30 Minutes Intravenous Every 6 hours 09/23/11 2217 09/24/11 1232   09/23/11 1232   ceFAZolin (ANCEF) IVPB 2 g/50 mL premix        2 g 100 mL/hr over 30 Minutes Intravenous 60 min pre-op 09/23/11 1232 09/23/11 1831          Assessment/Plan: Principal Problem:  Present on Admission:  Possible bilateral quadriceps tendon  ruptures. S/P surgery 09/23/11.  Will follow up with Dr Lajoyce Corners in two weeks.  [facility] requested to evaluate for admission into program.  Leukocytosis -Not unexpected after surgery.  Finishing Ancef dosing from surgery  .HTN (hypertension) BP slightly elevated, on lisinopril and HCTZ.  Will monitor.  .DM (diabetes mellitus) Will resume metformin at d/c.  On amaryl and sliding scale ac/hs.  Carb modified diet.  .Hyperlipidemia  On statin  *DVT Prophylaxis Was on heparin this morning.  Held after 10 am dose today for possible surgery.Will need to resume in am.  Code Status Full Code  Disposition CIR evaluation in progress.  Stephani Police 09/24/2011, 2:16 PM [phone]  I have seen and examined the patient, agree with the assessment and plan as outlined above.Await CIR evaluation.  Dr Windell Norfolk

## 2011-09-24 NOTE — Progress Notes (Signed)
   CARE MANAGEMENT NOTE 09/24/2011  Patient:  [name] [name],[name] [name]   Account Number:  [id number]  Date Initiated:  09/23/2011  Documentation initiated by:  Donn Pierini  Subjective/Objective Assessment:   Pt admitted with quadricep tendon rupture bil. - pt to go to OR for repair     Action/Plan:   PTA pt lived at home alone, was independent with ADLs- will need PT/OT evals post-op   Anticipated DC Date:  09/27/2011   Anticipated DC Plan:  HOME W HOME HEALTH SERVICES      DC Planning Services  CM consult      Choice offered to / List presented to:             Status of service:  In process, will continue to follow Medicare Important Message given?   (If response is "NO", the following Medicare IM given date fields will be blank) Date Medicare IM given:   Date Additional Medicare IM given:    Discharge Disposition:    Per UR Regulation:    If discussed at Long Length of Stay Meetings, dates discussed:    Comments:  PCP- Jerl Mina- [facility]  09/24/11- 1200- Donn Pierini RN, BSN [phone] Spoke with pt at bedside- per conversation pt states the plan is to go stay with his sister, [name] [name] in [place] for awhile until he is able to manage on his own. He states that he does have medication benefits and transportation. PT recommending CIR- consult ordered. CM to follow for d/c planning.  4/1/113- 1618- Donn Pierini RN, BSN [phone] Plan for OR repair of bil tendon ruptures. CM to follow post op for d/c needs.

## 2011-09-24 NOTE — Consult Note (Signed)
Physical Medicine and Rehabilitation Consult Reason for Consult: quad tendon rupture Referring Phsyician: Dr. 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  He felt a pop on his right knee first, and subsequently, felt another pop in his left knee. He has pain proximal to both knees now. Evaluation in the emergency room on 04/01  included plain films of both the right and left knees, with soft tissue swelling suggestive of quadricep ruptures bilaterally. Dr. Lajoyce Corners consulted and patient underwent quad tendon repair on 04/01 pm.  Post op WBAT and to ambulate with KI in place.  PT evaluation today. MD, PT recommending CIR.   Review of Systems  HENT: Negative for hearing loss.   Eyes: Negative for blurred vision and double vision.  Respiratory: Negative for cough and wheezing.   Cardiovascular: Negative for chest pain and palpitations.  Gastrointestinal: Negative for heartburn and nausea.  Genitourinary: Negative for urgency and frequency.  Musculoskeletal: Positive for joint pain. Negative for myalgias.  Neurological: Negative for dizziness and headaches.  Psychiatric/Behavioral: Negative for depression.   Past Medical History  Diagnosis Date  . Hypertension   . Diabetes mellitus    History reviewed. No pertinent past surgical history.  Family History  Problem Relation Age of Onset  . Lung cancer Father   . Heart disease Father   . Heart disease Brother     Social History: Lives alone. Works as a Naval architect. He  reports that he smoked for about a year-quit 30 yrs ago. He does not have any smokeless tobacco history on file. He reports that he does not drink alcohol or use illicit drugs. Plans on d/c to sister and brothe-in-law's home.  They don't work and can assist past discharge.   No Known Allergies  Prior to Admission medications   Medication Sig Start Date End Date Taking?  Authorizing Provider  glimepiride (AMARYL) 4 MG tablet Take 4 mg by mouth 2 (two) times daily.   Yes Historical Provider, MD  lisinopril-hydrochlorothiazide (PRINZIDE,ZESTORETIC) 20-25 MG per tablet Take 1 tablet by mouth daily.   Yes Historical Provider, MD  metFORMIN (GLUCOPHAGE) 1000 MG tablet Take 1,000-1,500 mg by mouth 2 (two) times daily. 1000 mg in the evening and 1500 mg at night   Yes Historical Provider, MD  simvastatin (ZOCOR) 20 MG tablet Take 20 mg by mouth every evening.   Yes Historical Provider, MD   Scheduled Medications:    .  ceFAZolin (ANCEF) IV  2 g Intravenous 60 min Pre-Op  .  ceFAZolin (ANCEF) IV  2 g Intravenous Q6H  . chlorhexidine  60 mL Topical Once  . docusate sodium  100 mg Oral BID  . glimepiride  4 mg Oral BID WC  . lisinopril  20 mg Oral Daily   And  . hydrochlorothiazide  25 mg Oral Daily  . insulin aspart  0-15 Units Subcutaneous Q4H  . methocarbamol (ROBAXIN) IV  500 mg Intravenous To PACU  . simvastatin  20 mg Oral q1800  . DISCONTD: heparin  5,000 Units Subcutaneous Q8H  . DISCONTD: insulin aspart  0-9 Units Subcutaneous Q4H  . DISCONTD: lisinopril  10 mg Oral BID  . DISCONTD: lisinopril-hydrochlorothiazide  1 tablet Oral Daily  . DISCONTD: simvastatin  10 mg Oral QHS   PRN MED's: HYDROcodone-acetaminophen, HYDROmorphone (DILAUDID) injection, HYDROmorphone, methocarbamol (ROBAXIN) IV, methocarbamol, metoCLOPramide (REGLAN) injection, metoCLOPramide, ondansetron (ZOFRAN) IV, ondansetron, oxyCODONE, oxyCODONE-acetaminophen, DISCONTD:  0.9 % irrigation (POUR BTL), DISCONTD: HYDROmorphone, DISCONTD: HYDROmorphone, DISCONTD: HYDROmorphone, DISCONTD: morphine, DISCONTD: ondansetron (ZOFRAN) IV, DISCONTD: ondansetron (ZOFRAN) IV DISCONTD: ondansetron Home: Home Living Lives With:  (Will go to sisters home) Type of Home: Other (Comment) (condo) Home Layout: One level Home Access: Level entry Home Adaptive Equipment: None  Functional History: Prior  Function Level of Independence: Independent with basic ADLs;Independent with transfers;Independent with homemaking with ambulation;Independent with gait Driving: Yes Vocation: Full time employment Functional Status:  Mobility: Bed Mobility Bed Mobility: Yes Supine to Sit: 1: +2 Total assist;Patient percentage (comment);HOB flat (pt=50%) Supine to Sit Details (indicate cue type and reason): Assist to move legs and bring trunk up Sitting - Scoot to Edge of Bed: 4: Min assist Sitting - Scoot to Edge of Bed Details (indicate cue type and reason): assist to support legs Sit to Supine: 3: Mod assist;HOB flat Sit to Supine - Details (indicate cue type and reason): Assist to bring legs up to bed Scooting to [facility]: 5: Supervision;With rail;Other (comment) (bed in trendelenburg) Transfers Sit to Stand: 1: +2 Total assist;Patient percentage (comment);From bed;With upper extremity assist (pt = 60%) Sit to Stand Details (indicate cue type and reason): pt used arms on walker to muscle upper body up while bringing extended knees under him Stand to Sit: Patient percentage (comment);1: +2 Total assist;To bed (pt = 60%) Stand to Sit Details: Assist to control descent due to bilateral knee immobilizers Ambulation/Gait Ambulation/Gait Assistance: 4: Min assist Ambulation/Gait Assistance Details (indicate cue type and reason): Pt sidestepped 3 feet along side of bed.  Pt slightly dizzy so didn't attempt further amb Ambulation Distance (Feet): 3 Feet Assistive device: Rolling walker    ADL:    Cognition: Cognition Arousal/Alertness: Awake/alert Orientation Level: Oriented X4 Cognition Arousal/Alertness: Awake/alert Overall Cognitive Status: Appears within functional limits for tasks assessed Orientation Level: Oriented X4  Blood pressure 155/87, pulse 97, temperature 97.7 F (36.5 C), temperature source Oral, resp. rate 18, height 5\' 8"  (1.727 m), weight 117.6 kg (259 lb 4.2 oz), SpO2  94.00%. Physical Exam  Nursing note and vitals reviewed. Constitutional: He is oriented to person, place, and time. He appears well-developed and well-nourished.  HENT:  Head: Normocephalic and atraumatic.  Eyes: Pupils are equal, round, and reactive to light.  Neck: Normal range of motion.  Cardiovascular: Normal rate and regular rhythm.   Pulmonary/Chest: Effort normal and breath sounds normal.  Abdominal: Soft. Bowel sounds are normal. He exhibits no distension. There is no tenderness.  Musculoskeletal:       Compressive dressing bilateral knees. No pedal edema. Both knees are appropriate tender. Knees were not ranged because of his orthopedic injury.  Neurological: He is alert and oriented to person, place, and time. He has normal strength and normal reflexes. No cranial nerve deficit or sensory deficit.  Skin: Skin is warm and dry.  Psychiatric: He has a normal mood and affect. His behavior is normal. Judgment and thought content normal.    Results for orders placed during the hospital encounter of 09/22/11 (from the past 24 hour(s))  GLUCOSE, CAPILLARY     Status: Abnormal   Collection Time   09/23/11  3:46 PM      Component Value Range   Glucose-Capillary 171 (*) 70 - 99 (mg/dL)  GLUCOSE, CAPILLARY     Status: Abnormal   Collection Time   09/23/11  7:29 PM      Component Value Range   Glucose-Capillary 141 (*) 70 - 99 (mg/dL)  GLUCOSE, CAPILLARY  Status: Abnormal   Collection Time   09/23/11 10:29 PM      Component Value Range   Glucose-Capillary 183 (*) 70 - 99 (mg/dL)   Comment 1 Notify RN     Comment 2 Documented in Chart    GLUCOSE, CAPILLARY     Status: Abnormal   Collection Time   09/24/11 12:55 AM      Component Value Range   Glucose-Capillary 167 (*) 70 - 99 (mg/dL)   Comment 1 Notify RN    GLUCOSE, CAPILLARY     Status: Abnormal   Collection Time   09/24/11  4:27 AM      Component Value Range   Glucose-Capillary 178 (*) 70 - 99 (mg/dL)  CBC     Status: Abnormal    Collection Time   09/24/11  5:42 AM      Component Value Range   WBC 15.7 (*) 4.0 - 10.5 (K/uL)   RBC 4.30  4.22 - 5.81 (MIL/uL)   Hemoglobin 13.0  13.0 - 17.0 (g/dL)   HCT 36.6  44.0 - 34.7 (%)   MCV 92.8  78.0 - 100.0 (fL)   MCH 30.2  26.0 - 34.0 (pg)   MCHC 32.6  30.0 - 36.0 (g/dL)   RDW 42.5  95.6 - 38.7 (%)   Platelets 360  150 - 400 (K/uL)  BASIC METABOLIC PANEL     Status: Abnormal   Collection Time   09/24/11  5:42 AM      Component Value Range   Sodium 137  135 - 145 (mEq/L)   Potassium 4.0  3.5 - 5.1 (mEq/L)   Chloride 99  96 - 112 (mEq/L)   CO2 29  19 - 32 (mEq/L)   Glucose, Bld 189 (*) 70 - 99 (mg/dL)   BUN 14  6 - 23 (mg/dL)   Creatinine, Ser 5.64  0.50 - 1.35 (mg/dL)   Calcium 8.6  8.4 - 33.2 (mg/dL)   GFR calc non Af Amer >90  >90 (mL/min)   GFR calc Af Amer >90  >90 (mL/min)  GLUCOSE, CAPILLARY     Status: Abnormal   Collection Time   09/24/11  7:42 AM      Component Value Range   Glucose-Capillary 176 (*) 70 - 99 (mg/dL)   Dg Knee 2 Views Left  09/23/2011  *RADIOLOGY REPORT*  Clinical Data: Fall down steps after legs gain valve.  Pain and swelling in the left knee.  LEFT KNEE - 1-2 VIEW  Comparison: None.  Findings: Mild degenerative narrowing of the medial and lateral compartments.  No definite effusion.  There is infiltration and thickening of soft tissues in the suprapatellar region with soft tissue hematoma and small displaced bone fragment.  Changes suggest hematoma and rupture of the quadriceps tendon insertion.  No displaced fracture or subluxation demonstrated in the femur or tibia.  No focal bone lesion or bone destruction.  IMPRESSION: Soft tissue thickening and hematoma with focal calcification or bone fragment in the suprapatellar space suggesting hematoma and rupture of the quadriceps tendon insertion.  Original Report Authenticated By: Marlon Pel, M.D.   Dg Knee 2 Views Right  09/23/2011  *RADIOLOGY REPORT*  Clinical Data: Injured right knee.   RIGHT KNEE - 1-2 VIEW  Comparison: None  Findings: The joint spaces are fairly well maintained.  There are mild tricompartmental degenerative changes.  No acute fracture or osteochondral abnormality.  Findings suspicious for a quadriceps tendon rupture with edema and fluid surrounding the upper anterior  aspect of the knee.  A joint effusion is noted.  IMPRESSION:  1.  No acute bony findings. 2.  Suspect ruptured quadriceps tendon.  Original Report Authenticated By: P. Loralie Champagne, M.D.    Assessment/Plan: Diagnosis: Bilateral quadriceps tendon ruptures 1. Does the need for close, 24 hr/day medical supervision in concert with the patient's rehab needs make it unreasonable for this patient to be served in a less intensive setting? Yes 2. Co-Morbidities requiring supervision/potential complications: Hypertension, morbid obesity, diabetes 3. Due to bladder management, bowel management, safety, skin/wound care, disease management, medication administration, pain management and patient education, does the patient require 24 hr/day rehab nursing? Yes 4. Does the patient require coordinated care of a physician, rehab nurse, PT (1-2 hrs/day, 5 days/week) and OT (1-2 hrs/day, 5 days/week) to address physical and functional deficits in the context of the above medical diagnosis(es)? Yes Addressing deficits in the following areas: balance, endurance, locomotion, strength, transferring, bowel/bladder control, bathing, dressing, feeding, grooming and toileting 5. Can the patient actively participate in an intensive therapy program of at least 3 hrs of therapy per day at least 5 days per week? Yes 6. The potential for patient to make measurable gains while on inpatient rehab is excellent 7. Anticipated functional outcomes upon discharge from inpatients are modified independent at the wheelchair level  PT, modified independent to set up/min assist OT,  8. Estimated rehab length of stay to reach the above functional  goals is: 7-10 days 9. Does the patient have adequate social supports to accommodate these discharge functional goals? Yes 10. Anticipated D/C setting: Home 11. Anticipated post D/C treatments: HH therapy 12. Overall Rehab/Functional Prognosis: excellent  RECOMMENDATIONS: This patient's condition is appropriate for continued rehabilitative care in the following setting: CIR Patient has agreed to participate in recommended program. Yes Note that insurance prior authorization may be required for reimbursement for recommended care.  Comment: Patient came to home to his sister's house which seems to be the most reasonable option at discharge.   Ranelle Oyster M.D. 09/24/2011

## 2011-09-24 NOTE — Evaluation (Signed)
Physical Therapy Evaluation Patient Details Name: [name] [name] MRN: [id number] DOB: [birth year] Today's Date: 09/24/2011  Problem List:  Patient Active Problem List  Diagnoses  . Quadriceps tendon rupture  . HTN (hypertension)  . DM (diabetes mellitus)  . Hyperlipidemia    Past Medical History:  Past Medical History  Diagnosis Date  . Hypertension   . Diabetes mellitus    Past Surgical History: History reviewed. No pertinent past surgical history.  PT Assessment/Plan/Recommendation PT Assessment Clinical Impression Statement: Pt adm with bil quad tendon repair after rupture.  Pt motivated to participate.  Expect pt will make steady progress and eventually be able to return home after rehab.  Recommend inpatient rehab consult. PT Recommendation/Assessment: Patient will need skilled PT in the acute care venue PT Problem List: Decreased mobility;Pain;Decreased knowledge of use of DME;Decreased strength;Decreased activity tolerance PT Therapy Diagnosis : Difficulty walking;Acute pain PT Plan PT Frequency: Min 5X/week PT Treatment/Interventions: DME instruction;Gait training;Functional mobility training;Therapeutic activities;Balance training;Patient/family education PT Recommendation Recommendations for Other Services: OT consult Follow Up Recommendations: Inpatient Rehab Equipment Recommended: Defer to next venue PT Goals  Acute Rehab PT Goals PT Goal Formulation: With patient Time For Goal Achievement: 7 days Pt will go Supine/Side to Sit: with min assist PT Goal: Supine/Side to Sit - Progress: Goal set today Pt will go Sit to Supine/Side: with min assist PT Goal: Sit to Supine/Side - Progress: Goal set today Pt will go Sit to Stand: with min assist PT Goal: Sit to Stand - Progress: Goal set today Pt will go Stand to Sit: with min assist PT Goal: Stand to Sit - Progress: Goal set today Pt will Ambulate: 51 - 150 feet;with supervision;with least restrictive assistive  device PT Goal: Ambulate - Progress: Goal set today  PT Evaluation Precautions/Restrictions  Precautions Required Braces or Orthoses: Yes Knee Immobilizer: On at all times;Other (comment) (MD told pt/nurse that immobilizers could be off when in bed) Restrictions Weight Bearing Restrictions: Yes RLE Weight Bearing: Weight bearing as tolerated LLE Weight Bearing: Weight bearing as tolerated Prior Functioning  Home Living Lives With:  (Will go to sisters home) Type of Home: Other (Comment) (condo) Home Layout: One level Home Access: Level entry Home Adaptive Equipment: None Prior Function Level of Independence: Independent with basic ADLs;Independent with transfers;Independent with homemaking with ambulation;Independent with gait Driving: Yes Vocation: Full time employment Cognition Cognition Arousal/Alertness: Awake/alert Overall Cognitive Status: Appears within functional limits for tasks assessed Orientation Level: Oriented X4 Sensation/Coordination   Extremity Assessment RLE Strength RLE Overall Strength Comments: Pt in knee immobilizer.  Pt able to assist with abduction/adduction. LLE Strength LLE Overall Strength Comments: Pt in knee immobilizer.  Pt able to assist with abduction/adduction. Mobility (including Balance) Bed Mobility Bed Mobility: Yes Supine to Sit: 1: +2 Total assist;Patient percentage (comment);HOB flat (pt=50%) Supine to Sit Details (indicate cue type and reason): Assist to move legs and bring trunk up Sitting - Scoot to Edge of Bed: 4: Min assist Sitting - Scoot to Edge of Bed Details (indicate cue type and reason): assist to support legs Sit to Supine: 3: Mod assist;HOB flat Sit to Supine - Details (indicate cue type and reason): Assist to bring legs up to bed Scooting to [facility]: 5: Supervision;With rail;Other (comment) (bed in trendelenburg) Transfers Sit to Stand: 1: +2 Total assist;Patient percentage (comment);From bed;With upper extremity assist  (pt = 60%) Sit to Stand Details (indicate cue type and reason): pt used arms on walker to muscle upper body up while bringing extended knees under him  Stand to Sit: Patient percentage (comment);1: +2 Total assist;To bed (pt = 60%) Stand to Sit Details: Assist to control descent due to bilateral knee immobilizers Ambulation/Gait Ambulation/Gait Assistance: 4: Min assist Ambulation/Gait Assistance Details (indicate cue type and reason): Pt sidestepped 3 feet along side of bed.  Pt slightly dizzy so didn't attempt further amb Ambulation Distance (Feet): 3 Feet Assistive device: Rolling walker    Exercise    End of Session PT - End of Session Equipment Utilized During Treatment: Gait belt Activity Tolerance: Patient tolerated treatment well Patient left: in bed Nurse Communication: Mobility status for transfers;Mobility status for ambulation General Behavior During Session: [facility] for tasks performed Cognition: [facility] for tasks performed  [facility] 09/24/2011, 1:33 PM  [facility] PT [phone]

## 2011-09-24 NOTE — Progress Notes (Signed)
Patient ID: [name] [name], male   DOB: [birth year], 56 y.o.   MRN: [id number] Patient comfortable, PT progressive ambulation, WBAT with immobilizers on, OK for D/C, I'll F/U in 2 weeks

## 2011-09-25 ENCOUNTER — Inpatient Hospital Stay (HOSPITAL_COMMUNITY)
Admission: AD | Admit: 2011-09-25 | Discharge: 2011-10-02 | DRG: 945 | Disposition: A | Discharge status: Home Health | Payer: Worker's Compensation | Source: Ambulatory Visit | Attending: Physical Medicine & Rehabilitation | Admitting: Physical Medicine & Rehabilitation

## 2011-09-25 ENCOUNTER — Encounter (HOSPITAL_COMMUNITY): Payer: Self-pay | Admitting: Orthopedic Surgery

## 2011-09-25 DIAGNOSIS — S86819A Strain of other muscle(s) and tendon(s) at lower leg level, unspecified leg, initial encounter: Secondary | ICD-10-CM | POA: Diagnosis present

## 2011-09-25 DIAGNOSIS — W010XXA Fall on same level from slipping, tripping and stumbling without subsequent striking against object, initial encounter: Secondary | ICD-10-CM | POA: Diagnosis present

## 2011-09-25 DIAGNOSIS — Z5189 Encounter for other specified aftercare: Principal | ICD-10-CM

## 2011-09-25 DIAGNOSIS — D62 Acute posthemorrhagic anemia: Secondary | ICD-10-CM | POA: Diagnosis present

## 2011-09-25 DIAGNOSIS — E785 Hyperlipidemia, unspecified: Secondary | ICD-10-CM | POA: Diagnosis present

## 2011-09-25 DIAGNOSIS — S838X9A Sprain of other specified parts of unspecified knee, initial encounter: Secondary | ICD-10-CM | POA: Diagnosis present

## 2011-09-25 DIAGNOSIS — E669 Obesity, unspecified: Secondary | ICD-10-CM

## 2011-09-25 DIAGNOSIS — I10 Essential (primary) hypertension: Secondary | ICD-10-CM | POA: Diagnosis present

## 2011-09-25 DIAGNOSIS — D72829 Elevated white blood cell count, unspecified: Secondary | ICD-10-CM | POA: Diagnosis present

## 2011-09-25 DIAGNOSIS — G4700 Insomnia, unspecified: Secondary | ICD-10-CM | POA: Diagnosis present

## 2011-09-25 DIAGNOSIS — S76119A Strain of unspecified quadriceps muscle, fascia and tendon, initial encounter: Secondary | ICD-10-CM

## 2011-09-25 DIAGNOSIS — E119 Type 2 diabetes mellitus without complications: Secondary | ICD-10-CM | POA: Diagnosis present

## 2011-09-25 DIAGNOSIS — M66259 Spontaneous rupture of extensor tendons, unspecified thigh: Secondary | ICD-10-CM

## 2011-09-25 LAB — GLUCOSE, CAPILLARY
Glucose-Capillary: 165 mg/dL — ABNORMAL HIGH (ref 70–99)
Glucose-Capillary: 212 mg/dL — ABNORMAL HIGH (ref 70–99)

## 2011-09-25 LAB — CBC
Hemoglobin: 12.5 g/dL — ABNORMAL LOW (ref 13.0–17.0)
MCV: 92.3 fL (ref 78.0–100.0)
Platelets: 376 10*3/uL (ref 150–400)
RBC: 4.13 MIL/uL — ABNORMAL LOW (ref 4.22–5.81)
WBC: 18.2 10*3/uL — ABNORMAL HIGH (ref 4.0–10.5)

## 2011-09-25 LAB — URINE MICROSCOPIC-ADD ON

## 2011-09-25 LAB — URINALYSIS, ROUTINE W REFLEX MICROSCOPIC
Ketones, ur: 40 mg/dL — AB
Nitrite: NEGATIVE
Protein, ur: 30 mg/dL — AB
Urobilinogen, UA: 0.2 mg/dL (ref 0.0–1.0)

## 2011-09-25 MED ORDER — SENNOSIDES-DOCUSATE SODIUM 8.6-50 MG PO TABS: 1.0000 | ORAL_TABLET | Freq: Every day | ORAL | Status: DC

## 2011-09-25 MED ORDER — BISACODYL 10 MG RE SUPP: 10.0000 mg | Freq: Every day | RECTAL | Status: DC | PRN

## 2011-09-25 MED ORDER — INSULIN ASPART 100 UNIT/ML ~~LOC~~ SOLN
4.0000 [IU] | Freq: Three times a day (TID) | SUBCUTANEOUS | Status: AC
  Administered 2011-09-25 – 2011-09-27 (×6): 4 [IU] via SUBCUTANEOUS

## 2011-09-25 MED ORDER — ONDANSETRON HCL 4 MG PO TABS: 4.0000 mg | ORAL_TABLET | Freq: Four times a day (QID) | ORAL | Status: DC | PRN

## 2011-09-25 MED ORDER — OXYCODONE HCL 5 MG PO TABS
10.0000 mg | ORAL_TABLET | Freq: Two times a day (BID) | ORAL | Status: DC
  Administered 2011-09-25 – 2011-09-26 (×2): 10 mg via ORAL
  Filled 2011-09-25 (×2): qty 2

## 2011-09-25 MED ORDER — ALUM & MAG HYDROXIDE-SIMETH 200-200-20 MG/5ML PO SUSP: 30.0000 mL | ORAL | Status: DC | PRN

## 2011-09-25 MED ORDER — SENNOSIDES-DOCUSATE SODIUM 8.6-50 MG PO TABS
1.0000 | ORAL_TABLET | Freq: Once | ORAL | Status: AC
  Administered 2011-09-25: 1 via ORAL
  Filled 2011-09-25: qty 1

## 2011-09-25 MED ORDER — DOCUSATE SODIUM 100 MG PO CAPS
100.0000 mg | ORAL_CAPSULE | Freq: Two times a day (BID) | ORAL | Status: DC
  Administered 2011-09-25 – 2011-10-02 (×14): 100 mg via ORAL
  Filled 2011-09-25 (×18): qty 1

## 2011-09-25 MED ORDER — FLEET ENEMA 7-19 GM/118ML RE ENEM
1.0000 | ENEMA | Freq: Once | RECTAL | Status: AC | PRN
  Filled 2011-09-25: qty 1

## 2011-09-25 MED ORDER — TRAZODONE HCL 50 MG PO TABS: 25.0000 mg | ORAL_TABLET | Freq: Every evening | ORAL | Status: DC | PRN

## 2011-09-25 MED ORDER — PNEUMOCOCCAL VAC POLYVALENT 25 MCG/0.5ML IJ INJ
0.5000 mL | INJECTION | Freq: Once | INTRAMUSCULAR | Status: AC
  Administered 2011-09-26: 0.5 mL via INTRAMUSCULAR
  Filled 2011-09-25 (×2): qty 0.5

## 2011-09-25 MED ORDER — TRAMADOL HCL 50 MG PO TABS
50.0000 mg | ORAL_TABLET | Freq: Four times a day (QID) | ORAL | Status: DC | PRN
  Administered 2011-09-30: 50 mg via ORAL
  Filled 2011-09-25: qty 1

## 2011-09-25 MED ORDER — SENNOSIDES-DOCUSATE SODIUM 8.6-50 MG PO TABS
2.0000 | ORAL_TABLET | Freq: Every day | ORAL | Status: DC
  Administered 2011-09-25 – 2011-10-01 (×7): 2 via ORAL
  Filled 2011-09-25 (×8): qty 2

## 2011-09-25 MED ORDER — METHOCARBAMOL 500 MG PO TABS
500.0000 mg | ORAL_TABLET | Freq: Four times a day (QID) | ORAL | Status: DC | PRN
  Administered 2011-09-29 – 2011-10-01 (×4): 500 mg via ORAL
  Filled 2011-09-25 (×4): qty 1

## 2011-09-25 MED ORDER — DIPHENHYDRAMINE HCL 12.5 MG/5ML PO ELIX
12.5000 mg | ORAL_SOLUTION | Freq: Four times a day (QID) | ORAL | Status: DC | PRN
  Filled 2011-09-25: qty 10

## 2011-09-25 MED ORDER — ENOXAPARIN SODIUM 40 MG/0.4ML ~~LOC~~ SOLN
40.0000 mg | SUBCUTANEOUS | Status: DC
  Administered 2011-09-25 – 2011-10-01 (×7): 40 mg via SUBCUTANEOUS
  Filled 2011-09-25 (×8): qty 0.4

## 2011-09-25 MED ORDER — OXYCODONE HCL 5 MG PO TABS
5.0000 mg | ORAL_TABLET | ORAL | Status: DC | PRN
  Administered 2011-09-26 – 2011-09-30 (×11): 10 mg via ORAL
  Administered 2011-09-30 (×2): 5 mg via ORAL
  Administered 2011-09-30 – 2011-10-01 (×3): 10 mg via ORAL
  Filled 2011-09-25 (×3): qty 2
  Filled 2011-09-25: qty 1
  Filled 2011-09-25 (×14): qty 2

## 2011-09-25 MED ORDER — CEFUROXIME AXETIL 500 MG PO TABS
500.0000 mg | ORAL_TABLET | Freq: Two times a day (BID) | ORAL | Status: DC
  Filled 2011-09-25 (×2): qty 1

## 2011-09-25 MED ORDER — ONDANSETRON HCL 4 MG/2ML IJ SOLN: 4.0000 mg | Freq: Four times a day (QID) | INTRAMUSCULAR | Status: DC | PRN

## 2011-09-25 MED ORDER — INSULIN ASPART 100 UNIT/ML ~~LOC~~ SOLN
0.0000 [IU] | Freq: Three times a day (TID) | SUBCUTANEOUS | Status: DC
  Administered 2011-09-25: 3 [IU] via SUBCUTANEOUS
  Administered 2011-09-26 (×2): 5 [IU] via SUBCUTANEOUS
  Administered 2011-09-26: 2 [IU] via SUBCUTANEOUS
  Administered 2011-09-27 (×3): 5 [IU] via SUBCUTANEOUS
  Administered 2011-09-28 (×2): 3 [IU] via SUBCUTANEOUS
  Administered 2011-09-28 – 2011-09-29 (×2): 5 [IU] via SUBCUTANEOUS
  Administered 2011-09-29: 2 [IU] via SUBCUTANEOUS
  Administered 2011-09-29 – 2011-09-30 (×3): 3 [IU] via SUBCUTANEOUS
  Administered 2011-09-30: 2 [IU] via SUBCUTANEOUS
  Administered 2011-10-01: 3 [IU] via SUBCUTANEOUS
  Administered 2011-10-01: 2 [IU] via SUBCUTANEOUS
  Administered 2011-10-01 – 2011-10-02 (×2): 3 [IU] via SUBCUTANEOUS

## 2011-09-25 MED ORDER — LISINOPRIL 20 MG PO TABS
20.0000 mg | ORAL_TABLET | Freq: Every day | ORAL | Status: DC
  Administered 2011-09-26 – 2011-09-27 (×2): 20 mg via ORAL
  Filled 2011-09-25 (×4): qty 1

## 2011-09-25 MED ORDER — GLIMEPIRIDE 4 MG PO TABS
4.0000 mg | ORAL_TABLET | Freq: Every day | ORAL | Status: DC
  Administered 2011-09-26 – 2011-09-27 (×2): 4 mg via ORAL
  Filled 2011-09-25 (×3): qty 1

## 2011-09-25 MED ORDER — CEFUROXIME AXETIL 500 MG PO TABS
500.0000 mg | ORAL_TABLET | Freq: Two times a day (BID) | ORAL | Status: DC
  Administered 2011-09-25 – 2011-09-30 (×10): 500 mg via ORAL
  Filled 2011-09-25 (×12): qty 1

## 2011-09-25 MED ORDER — HYDROCHLOROTHIAZIDE 25 MG PO TABS
25.0000 mg | ORAL_TABLET | Freq: Every day | ORAL | Status: DC
  Administered 2011-09-26 – 2011-09-27 (×2): 25 mg via ORAL
  Filled 2011-09-25 (×4): qty 1

## 2011-09-25 MED ORDER — ACETAMINOPHEN 325 MG PO TABS: 325.0000 mg | ORAL_TABLET | ORAL | Status: DC | PRN

## 2011-09-25 MED ORDER — INSULIN ASPART 100 UNIT/ML ~~LOC~~ SOLN
0.0000 [IU] | Freq: Every day | SUBCUTANEOUS | Status: DC
  Administered 2011-09-25 – 2011-09-26 (×2): 2 [IU] via SUBCUTANEOUS

## 2011-09-25 MED ORDER — SIMVASTATIN 20 MG PO TABS
20.0000 mg | ORAL_TABLET | Freq: Every day | ORAL | Status: DC
  Administered 2011-09-25 – 2011-10-01 (×7): 20 mg via ORAL
  Filled 2011-09-25 (×8): qty 1

## 2011-09-25 MED ORDER — GUAIFENESIN-DM 100-10 MG/5ML PO SYRP: 5.0000 mL | ORAL_SOLUTION | Freq: Four times a day (QID) | ORAL | Status: DC | PRN

## 2011-09-25 NOTE — Progress Notes (Signed)
Occupational Therapy Treatment Patient Details Name: [name] [name] MRN: [id number] DOB: [birth year] Today's Date: 09/25/2011  OT Assessment/Plan OT Assessment/Plan Comments on Treatment Session: Pt progressing very well OT Plan: Discharge plan remains appropriate Follow Up Recommendations: Inpatient Rehab Equipment Recommended: Defer to next venue OT Goals ADL Goals ADL Goal: Lower Body Bathing - Progress: Progressing toward goals ADL Goal: Lower Body Dressing - Progress: Progressing toward goals ADL Goal: Toilet Transfer - Progress: Progressing toward goals ADL Goal: Toileting - Clothing Manipulation - Progress: Progressing toward goals  OT Treatment Precautions/Restrictions  Precautions Precautions: Fall Required Braces or Orthoses: Yes (Bledsoe braces on bilateral legs) Knee Immobilizer: On at all times Restrictions Weight Bearing Restrictions: Yes RLE Weight Bearing: Weight bearing as tolerated LLE Weight Bearing: Weight bearing as tolerated   ADL ADL Lower Body Dressing: Performed;+1 Total assistance Lower Body Dressing Details (indicate cue type and reason): don/doff socks Where Assessed - Lower Body Dressing: Sitting, chair Toilet Transfer: Simulated;+2 Total assistance (pt=50%) Toilet Transfer Details (indicate cue type and reason): simulated sit to stand from chair Toileting - Clothing Manipulation: Minimal assistance;Simulated Toileting - Clothing Manipulation Details (indicate cue type and reason): difficulty unweighting arms long enough to assist with clothing manipulation Where Assessed - Toileting Clothing Manipulation: Standing Toileting - Hygiene: Not assessed Ambulation Related to ADLs: +2totalA(pt=80%) RW ambulation. Pt with inc weight bearing through UE's to dec WB through LE's. Pt unable to decrease WB through UE's as this caused inc pain in LE's. See PT note for more details Mobility  Bed Mobility Supine to Sit: 1: +2 Total assist;Patient percentage  (comment);HOB elevated (Comment degrees) (patient =40%) Supine to Sit Details (indicate cue type and reason): Pt required assistance to upright position in bed with HOB elevated. Pt required assistance with leg placment. Pt able to start moving legs towards EOB  Sitting - Scoot to Edge of Bed: 2: Max assist Sitting - Scoot to [employer] of Bed Details (indicate cue type and reason): Pt required assistance  with usage of pad. VC for efficient techquie  Transfers Sit to Stand: 1: +2 Total assist;From bed;With upper extremity assist;Patient percentage (comment) (Patient= 50% ) Sit to Stand Details (indicate cue type and reason): Pt required VC for hand placement on walker during ascent. Assistance required for initiate stand and ensure balance  Stand to Sit: 1: +2 Total assist;To chair/3-in-1;4: Min assist;With armrests;With upper extremity assist;Patient percentage (comment) (Patient=75%) Stand to Sit Details: Pt required VC to use arm rest and to control descent.   End of Session OT - End of Session Equipment Utilized During Treatment: Gait belt (Bledsoe's x2) Activity Tolerance: Patient tolerated treatment well Patient left: in chair;with call bell in reach Nurse Communication: Mobility status for transfers;Mobility status for ambulation General Behavior During Session: [facility] for tasks performed Cognition: [facility] for tasks performed  Vylet Maffia  09/25/2011, 2:34 PM

## 2011-09-25 NOTE — Progress Notes (Signed)
   CARE MANAGEMENT NOTE 09/25/2011  Patient:  [name],[name]   Account Number:  [id number]  Date Initiated:  09/23/2011  Documentation initiated by:  [name] [name]  Subjective/Objective Assessment:   Pt admitted with quadricep tendon rupture bil. - pt to go to OR for repair     Action/Plan:   PTA pt lived at home alone, was independent with ADLs- will need PT/OT evals post-op   Anticipated DC Date:  09/27/2011   Anticipated DC Plan:  HOME W HOME HEALTH SERVICES      DC Planning Services  CM consult      Choice offered to / List presented to:             Status of service:  Completed, signed off Medicare Important Message given?   (If response is "NO", the following Medicare IM given date fields will be blank) Date Medicare IM given:   Date Additional Medicare IM given:    Discharge Disposition:  IP REHAB FACILITY  Per UR Regulation:    If discussed at Long Length of Stay Meetings, dates discussed:    Comments:  PCP- [name]  09/25/11- 1100- [name] Pierini RN, BSN [phone] Pt to d/c to CIR today- has Worker's Comp. approvalBritta Mccreedy with CIR-following.  09/24/11- 1200- [name] Pierini RN, BSN [phone] Spoke with pt at bedside- per conversation pt states the plan is to go stay with his sister, [name] [name] in [place] for awhile until he is able to manage on his own. He states that he does have medication benefits and transportation. PT recommending CIR- consult ordered. CM to follow for d/c planning.  4/1/113- 1618- [name] Pierini RN, BSN [phone] Plan for OR repair of bil tendon ruptures. CM to follow post op for d/c needs.

## 2011-09-25 NOTE — PMR Pre-admission (Signed)
PMR Admission Coordinator Pre-Admission Assessment  Patient: [name] [name] is an 56 y.o., male MRN: [id number] DOB: May 03, 1956 Height: 5\' 8"  (172.7 cm) Weight: 117.6 kg (259 lb 4.2 oz)  Insurance Information HMO:     PPO:      PCP:      IPA:      80/20:      OTHER:  PRIMARY: Generic Workers Comp      Policy#: [id number]      Subscriber: pt CM Name: Sharol Given      Phone#: [phone] ext [phone]     Fax#: [phone] Pre-Cert#: approved 4/3      Employer: [employer] Benefits:  Phone #: n/a     Name: n/a Adjuster plans to assign a RN CM to case 4/3 Medicaid Application Date: n/a      Case Manager:  Disability Application Date: tba      Case Worker:   Emergency Conservator, museum/gallery Information    Name Relation Home Work Mobile   [facility] Sister [phone]     Crecencio Mc   [phone]     Current Medical History  Patient Admitting Diagnosis: Bilateral Quadriceps tendon ruptures History of Present Illness:  Mechanical fall, slipped on wet stairs. Admitted 09/23/11. Patient underwent quad tendon repair . Bledsoe braces applied this a.m.      Past Medical History  Past Medical History  Diagnosis Date  . Hypertension   . Diabetes mellitus     Family History  family history includes Heart disease in his brother and father and Lung cancer in his father.  Prior Rehab/Hospitalizations: none   Current Medications  Current facility-administered medications:0.9 %  sodium chloride infusion, , Intravenous, Continuous, Nadara Mustard, MD, Last Rate: 20 mL/hr at 09/23/11 2311;  ceFAZolin (ANCEF) IVPB 2 g/50 mL premix, 2 g, Intravenous, Q6H, Nadara Mustard, MD, 2 g at 09/24/11 1202;  docusate sodium (COLACE) capsule 100 mg, 100 mg, Oral, BID, Houston Siren, MD, 100 mg at 09/24/11 2221 hydrochlorothiazide (HYDRODIURIL) tablet 25 mg, 25 mg, Oral, Daily, Maretta Bees, MD, 25 mg at 09/24/11 0956;  HYDROcodone-acetaminophen (NORCO) 5-325 MG per  tablet 1-2 tablet, 1-2 tablet, Oral, Q4H PRN, Nadara Mustard, MD;  HYDROmorphone (DILAUDID) injection 0.5 mg, 0.5 mg, Intravenous, Q3H PRN, Stephani Police, PA, 0.5 mg at 09/25/11 0945 insulin aspart (novoLOG) injection 0-15 Units, 0-15 Units, Subcutaneous, TID WC, Stephani Police, PA, 3 Units at 09/25/11 0931;  insulin aspart (novoLOG) injection 0-5 Units, 0-5 Units, Subcutaneous, QHS, Marianne L York, PA;  insulin aspart (novoLOG) injection 4 Units, 4 Units, Subcutaneous, TID WC, Stephani Police, PA, 4 Units at 09/25/11 0929 lisinopril (PRINIVIL,ZESTRIL) tablet 20 mg, 20 mg, Oral, Daily, Maretta Bees, MD, 20 mg at 09/24/11 0956;  methocarbamol (ROBAXIN) 500 mg in dextrose 5 % 50 mL IVPB, 500 mg, Intravenous, Q6H PRN, Nadara Mustard, MD;  methocarbamol (ROBAXIN) tablet 500 mg, 500 mg, Oral, Q6H PRN, Nadara Mustard, MD;  metoCLOPramide (REGLAN) injection 5-10 mg, 5-10 mg, Intravenous, Q8H PRN, Nadara Mustard, MD metoCLOPramide (REGLAN) tablet 5-10 mg, 5-10 mg, Oral, Q8H PRN, Nadara Mustard, MD;  ondansetron [facility]) injection 4 mg, 4 mg, Intravenous, Q6H PRN, Nadara Mustard, MD;  ondansetron [facility]) tablet 4 mg, 4 mg, Oral, Q6H PRN, Nadara Mustard, MD;  oxyCODONE (Oxy IR/ROXICODONE) immediate release tablet 5 mg, 5 mg, Oral, Q4H PRN, Houston Siren, MD, 5 mg at 09/24/11 1608 oxyCODONE-acetaminophen (PERCOCET) 5-325 MG per tablet 1-2 tablet,  1-2 tablet, Oral, Q4H PRN, Nadara Mustard, MD;  senna-docusate (Senokot-S) tablet 1 tablet, 1 tablet, Oral, Once, Stephani Police, PA;  senna-docusate (Senokot-S) tablet 1 tablet, 1 tablet, Oral, QHS, Stephani Police, PA;  simvastatin (ZOCOR) tablet 20 mg, 20 mg, Oral, q1800, Nadara Mustard, MD, 20 mg at 09/24/11 1753 DISCONTD: glimepiride (AMARYL) tablet 4 mg, 4 mg, Oral, BID WC, Nadara Mustard, MD, 4 mg at 09/24/11 1610;  DISCONTD: insulin aspart (novoLOG) injection 0-15 Units, 0-15 Units, Subcutaneous, Q4H, Stephani Police, PA, 3 Units at 09/24/11 1331  Patients Current Diet:  Carb Control  Precautions / Restrictions Precautions Required Braces or Orthoses: Yes (Bilateral bledsoe braces) Knee Immobilizer: On at all times Restrictions Weight Bearing Restrictions: No RLE Weight Bearing: Weight bearing as tolerated LLE Weight Bearing: Weight bearing as tolerated   Prior Activity Level Community (5-7x/wk):  (yes) Home Assistive Devices / Equipment Home Assistive Devices/Equipment: CBG Meter Home Adaptive Equipment: None;Bedside commode/3-in-1;Walker - rolling  Prior Functional Level Prior Function Level of Independence: Independent with basic ADLs;Independent with transfers;Independent with homemaking with ambulation;Independent with gait Able to Take Stairs?: Yes Driving: Yes Vocation: Full time employment  Current Functional Level Cognition  Arousal/Alertness: Awake/alert Overall Cognitive Status: Appears within functional limits for tasks assessed Orientation Level: Oriented X4    Sensation  Light Touch: Appears Intact Stereognosis: Not tested Hot/Cold: Not tested Proprioception: Not tested    Coordination  Gross Motor Movements are Fluid and Coordinated: Yes Fine Motor Movements are Fluid and Coordinated: Yes    ADLs  Eating/Feeding: Simulated;Independent Where Assessed - Eating/Feeding: Edge of bed Grooming: Simulated;Set up Where Assessed - Grooming: Sitting, bed Upper Body Bathing: Simulated;Set up Where Assessed - Upper Body Bathing: Unsupported;Sitting, bed Lower Body Bathing: Simulated;Maximal assistance Where Assessed - Lower Body Bathing: Sit to stand from bed Upper Body Dressing: Simulated;Set up Where Assessed - Upper Body Dressing: Sitting, bed Lower Body Dressing: Simulated;Maximal assistance Where Assessed - Lower Body Dressing: Sit to stand from bed Toilet Transfer: Simulated;Moderate assistance Toilet Transfer Method: Proofreader: Other (comment) (simulated EOB.  Pt declined need to  toilet.) Toileting - Clothing Manipulation: Simulated;Moderate assistance Where Assessed - Toileting Clothing Manipulation: Sit to stand from 3-in-1 or toilet Toileting - Hygiene: Simulated;Moderate assistance Where Assessed - Toileting Hygiene: Sit to stand from 3-in-1 or toilet Tub/Shower Transfer: Not assessed Tub/Shower Transfer Method: Not assessed Equipment Used: Rolling walker Ambulation Related to ADLs: Pt min assist for taking small steps using the RW. ADL Comments: Pt will need education on AE for LB selfcare secondary to not having enough flexibilty in his trunk to reach his feet for LB selfcare.  Exhibits moderate difficulty with sit to stand which is to be expected based on the fact that he has 2 KIs.    Mobility  Bed Mobility: Yes Supine to Sit: 1: +1 Total assist;HOB elevated (Comment degrees) Supine to Sit Details (indicate cue type and reason): HOB 25 degrees Sitting - Scoot to Edge of Bed: 2: Max assist Sitting - Scoot to [place] of Bed Details (indicate cue type and reason): assist to support legs Sit to Supine: 2: Max assist Sit to Supine - Details (indicate cue type and reason): To help lift LEs up into the bed. Scooting to [facility]: 5: Supervision;With rail;Other (comment) (bed in trendelenburg)    Transfers  Sit to Stand: 3: Mod assist;Other (comment);From bed;From elevated surface Sit to Stand Details (indicate cue type and reason): Bed raised to help increase sit to stand. Stand  to Sit: 3: Mod assist;Without upper extremity assist Stand to Sit Details: Needs assist to slide LEs forward to sit.    Ambulation / Gait / Stairs / Wheelchair Mobility  Ambulation/Gait Ambulation/Gait Assistance: 4: Min assist Ambulation/Gait Assistance Details (indicate cue type and reason): Pt sidestepped 3 feet along side of bed.  Pt slightly dizzy so didn't attempt further amb Ambulation Distance (Feet): 3 Feet Assistive device: Rolling walker    Posture / Balance       Previous Home  Environment Living Arrangements: Alone Lives With: Alone Type of Home: Other (Comment) (condo) Home Layout: One level Home Access: Level entry Bathroom Shower/Tub: Health visitor: Standard Bathroom Accessibility: Yes How Accessible: Accessible via walker Home Care Services: Yes Additional Comments: Equipment is his brother-in-laws  Discharge Living Setting Plans for Discharge Living Setting: Lives with (comment) (To live with sister and her husband) Type of Home at Discharge: Other (Comment) (Condo) Discharge Home Layout: One level Discharge Home Access: Level entry Discharge Bathroom Accessibility: Yes How Accessible: Accessible via walker Do you have any problems obtaining your medications?: No  Social/Family/Support Systems Patient Roles:  (employee) Contact Information: Leward Quan, sister Anticipated Caregiver: Clydie Braun his sister Anticipated Caregiver's Contact Information: [phone] Ability/Limitations of Caregiver: no limitations Caregiver Availability: 24/7 Discharge Plan Discussed with Primary Caregiver: No (Patient discussed with his sister) Is Caregiver In Agreement with Plan?: Yes Does Caregiver/Family have Issues with Lodging/Transportation while Pt is in Rehab?: No  Goals/Additional Needs Patient/Family Goal for Rehab: Mod I w/c level PT, st up to min assist with OT Expected length of stay: ELOS 7 to 10 days Pt/Family Agrees to Admission and willing to participate: Yes Program Orientation Provided & Reviewed with Pt/Caregiver Including Roles  & Responsibilities: Yes  Patient Condition: This patient's condition remains as documented in the Consult dated 09/24/11, in which the Rehabilitation Physician determined and documented that the patient's condition is appropriate for intensive rehabilitative care in an inpatient rehabilitation facility.  Preadmission Screen Completed By:  Clois Dupes, 09/25/2011 10:24  AM ______________________________________________________________________   Discussed status with Dr. Riley Kill on 09/25/11 at 1031 and received telephone approval for admission today.  Admission Coordinator:  Clois Dupes, time 0981 Date 09/25/11.

## 2011-09-25 NOTE — Discharge Summary (Signed)
Addendum  Patient seen and examined, chart and data base reviewed.  I agree with the above assessment and plan.  For full details please see Mrs. Algis Downs PA. Note.  Patient to CIR for inpatient rehab.  Bilateral quadriceps tendons repair.  Clint Lipps Pager: [phone] 09/25/2011, 2:02 PM

## 2011-09-25 NOTE — Progress Notes (Signed)
Pt admitted to 4010 by wheelchair from 5500.Pt oriented to room, bed controls, call light. Reviewed with patient rehab routine, meals times, therapy schedules, lead nursing, Scheryl Marten lead nurse, team conference,white board, safety plan and video,agreement.Pt verbalized understanding of safety plan and need of interventions. Reviewed butterfly wish and posted sign at head of bed. Reviewed general education notebook with patient.  See CHL for details of head to toe assessment.  Will continue to monitor. Roberts[place]on[place], Quame Spratlin Elon Jester

## 2011-09-25 NOTE — Discharge Summary (Signed)
Patient ID: [name] [name] MRN: [id number] DOB/AGE: [birth year] 56 y.o.  Admit date: 09/22/2011 Discharge date: 09/25/2011  Primary Care Physician:  No primary provider on file.  Discharge Diagnoses:   Principal Problem:  *Quadriceps tendon rupture Active Problems:  HTN (hypertension)  DM (diabetes mellitus)  Hyperlipidemia   Medication List  As of 09/25/2011 11:17 AM   TAKE these medications         glimepiride 4 MG tablet   Commonly known as: AMARYL   Take 4 mg by mouth 2 (two) times daily.      lisinopril-hydrochlorothiazide 20-25 MG per tablet   Commonly known as: PRINZIDE,ZESTORETIC   Take 1 tablet by mouth daily.      metFORMIN 1000 MG tablet   Commonly known as: GLUCOPHAGE   Take 1,000-1,500 mg by mouth 2 (two) times daily. 1000 mg in the evening and 1500 mg at night      simvastatin 20 MG tablet   Commonly known as: ZOCOR   Take 20 mg by mouth every evening.      Oxycodone 5 mg by mouth every 4 hours PRN moderate pain  Dilaudid 0.5 mg IV every 3 hours PRN severe pain              Consults:  Orthopedic Surgery, Dr. 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 He felt a pop on his right knee first, and subsequently, felt another pop in his left knee. He has pain proximal to both knees now. Evaluation in the emergency room included plain films of both the right and left knees, with soft tissue swelling suggestive of quadricep ruptures bilaterally. He also was noted to have elevated white count with differential having mild left shift. His hemoglobin is normal. He has normal electrolytes and renal function tests. His blood glucose is 177 g per decaliter. Orthopedic physician was consulted by emergency room physician, and hospitalist was asked to admit patient.    1.  Bilateral quadriceps tendon rupture.  S/P  surgery 09/23/11. Will follow up with Dr Lajoyce Corners in two weeks. Being discharged to [facility].   2.  Leukocytosis.  Patient reports a history of Leukocytosis being worked up by hematology/oncology.  Per patient wbc is typically 12.0.  Wbc noted to climb post op.  Received Ancef for surgery.  Currently has a low grade temp (100.5), no cough, no sob.  Will check a urinalysis and start Ceftin 500 mg po bid for 7 days.  3.  Hypertension.  Controlled on lisinopril and HCTZ.  4.  Diabetes Mellitus. Stable.  Cbgs between 160 - 190.  On insulin as an inpatient.  Will discharge on home medications as listed above.  5.  Hyperlipidemia.  Continue home medication.   Physical Exam on Discharge: General: Alert, awake, oriented x3, in some pain as braces are being put on his legs.. Heart: Regular rate and rhythm, without murmurs, rubs, gallops. Lungs: Clear to auscultation bilaterally. Abdomen: obese,Soft, nontender, nondistended, positive bowel sounds. Extremities: Lower extremities bandages clean and dry.  No obvious edema in his feet.  Pulses in tact. Neuro: Grossly intact, nonfocal.  Filed Vitals:   09/24/11 1400 09/24/11 1430 09/24/11 2204 09/25/11 0545  BP:  151/73 154/83 131/80  Pulse: 85 95 108 93  Temp:  98.2 F (36.8 C) 100.5 F (38.1 C) 98.3 F (  36.8 C)  TempSrc:  Oral Oral Oral  Resp:  18 18 17   Height:      Weight:      SpO2: 93% 94% 91% 93%     Intake/Output Summary (Last 24 hours) at 09/25/11 1117 Last data filed at 09/25/11 0600  Gross per 24 hour  Intake 976.33 ml  Output   1300 ml  Net -323.67 ml    Basic Metabolic Panel:  Lab 09/24/11 1610 09/23/11 0941 09/23/11 0302  NA 137 -- 137  K 4.0 -- 3.9  CL 99 -- 100  CO2 29 -- 27  GLUCOSE 189* -- 166*  BUN 14 -- 13  CREATININE 0.77 0.78 --  CALCIUM 8.6 -- 9.2  MG -- -- --  PHOS -- -- --   CBC:  Lab 09/25/11 0630 09/24/11 0542 09/23/11 0302  WBC 18.2* 15.7* --  NEUTROABS -- -- 14.8*  HGB 12.5*  13.0 --  HCT 38.1* 39.9 --  MCV 92.3 92.8 --  PLT 376 360 --   CBG:  Lab 09/25/11 0740 09/24/11 2153 09/24/11 1712 09/24/11 1127 09/24/11 0742 09/24/11 0427  GLUCAP 165* 190* 191* 188* 176* 178*   Hemoglobin A1C:  Lab 09/23/11 0941  HGBA1C 7.3*    Significant Diagnostic Studies:  Dg Knee 2 Views Left  09/23/2011  *RADIOLOGY REPORT*  Clinical Data: Fall down steps after legs gain valve.  Pain and swelling in the left knee.  LEFT KNEE - 1-2 VIEW  Comparison: None.  Findings: Mild degenerative narrowing of the medial and lateral compartments.  No definite effusion.  There is infiltration and thickening of soft tissues in the suprapatellar region with soft tissue hematoma and small displaced bone fragment.  Changes suggest hematoma and rupture of the quadriceps tendon insertion.  No displaced fracture or subluxation demonstrated in the femur or tibia.  No focal bone lesion or bone destruction.  IMPRESSION: Soft tissue thickening and hematoma with focal calcification or bone fragment in the suprapatellar space suggesting hematoma and rupture of the quadriceps tendon insertion.  Original Report Authenticated By: Marlon Pel, M.D.   Dg Knee 2 Views Right  09/23/2011  *RADIOLOGY REPORT*  Clinical Data: Injured right knee.  RIGHT KNEE - 1-2 VIEW  Comparison: None  Findings: The joint spaces are fairly well maintained.  There are mild tricompartmental degenerative changes.  No acute fracture or osteochondral abnormality.  Findings suspicious for a quadriceps tendon rupture with edema and fluid surrounding the upper anterior aspect of the knee.  A joint effusion is noted.  IMPRESSION:  1.  No acute bony findings. 2.  Suspect ruptured quadriceps tendon.  Original Report Authenticated By: P. Loralie Champagne, M.D.      Disposition and Follow-up:  Stable for discharge to [facility].   Follow-up Information    Follow up with DUDA,MARCUS V, MD in 2 weeks.   Contact information:     [address] [employer] [place] [address] [phone]           Time spent on Discharge: 40 min.  Signed: Conley Canal [facility] 09/25/2011, 11:17 AM [phone]

## 2011-09-25 NOTE — Progress Notes (Signed)
Patient ID: [name] [name], male   DOB: 11/26/1955, 56 y.o.   MRN: [id number]  PCP:  No primary provider on file.  Consultation:  Orthopedic Surgery, Dr. Jonathon Bellows  Procedure:  Bilateral Quadricept Tendon Rupture Repair 09/23/11.  Subjective:   Objective: Weight change:   Intake/Output Summary (Last 24 hours) at 09/25/11 0744 Last data filed at 09/25/11 0600  Gross per 24 hour  Intake 1336.33 ml  Output   1300 ml  Net  36.33 ml   Blood pressure 131/80, pulse 93, temperature 98.3 F (36.8 C), temperature source Oral, resp. rate 17, height 5\' 8"  (1.727 m), weight 117.6 kg (259 lb 4.2 oz), SpO2 93.00%. Filed Vitals:   09/24/11 1400 09/24/11 1430 09/24/11 2204 09/25/11 0545  BP:  151/73 154/83 131/80  Pulse: 85 95 108 93  Temp:  98.2 F (36.8 C) 100.5 F (38.1 C) 98.3 F (36.8 C)  TempSrc:  Oral Oral Oral  Resp:  18 18 17   Height:      Weight:      SpO2: 93% 94% 91% 93%    Physical Exam: General:   Awake, A&O, NAD Lungs:  Clear to ausculatation. 0 w/c/r Cardiovascular:  RRR 0 m/r/g Abdomen: Obese, soft, nt, nd, +bs Extremities: Lower ext.  Bilateral bandages clean and dry..  Feet with no edema, pulses intact.  Basic Metabolic Panel:  Lab 09/24/11 3086 09/23/11 0941 09/23/11 0302  NA 137 -- 137  K 4.0 -- 3.9  CL 99 -- 100  CO2 29 -- 27  GLUCOSE 189* -- 166*  BUN 14 -- 13  CREATININE 0.77 0.78 --  CALCIUM 8.6 -- 9.2  MG -- -- --  PHOS -- -- --   CBC:  Lab 09/25/11 0630 09/24/11 0542 09/23/11 0302  WBC 18.2* 15.7* --  NEUTROABS -- -- 14.8*  HGB 12.5* 13.0 --  HCT 38.1* 39.9 --  MCV 92.3 92.8 --  PLT 376 360 --   CBG:  Lab 09/24/11 2153 09/24/11 1712 09/24/11 1127 09/24/11 0742 09/24/11 0427 09/24/11 0055  GLUCAP 190* 191* 188* 176* 178* 167*    Micro Results: Recent Results (from the past 240 hour(s))  SURGICAL PCR SCREEN     Status: Normal   Collection Time   09/23/11 12:40 PM      Component Value Range Status Comment   MRSA, PCR NEGATIVE  NEGATIVE   Final    Staphylococcus aureus NEGATIVE  NEGATIVE  Final     Studies/Results: Scheduled Meds:    .  ceFAZolin (ANCEF) IV  2 g Intravenous Q6H  . docusate sodium  100 mg Oral BID  . lisinopril  20 mg Oral Daily   And  . hydrochlorothiazide  25 mg Oral Daily  . insulin aspart  0-15 Units Subcutaneous TID WC  . insulin aspart  0-5 Units Subcutaneous QHS  . insulin aspart  4 Units Subcutaneous TID WC  . simvastatin  20 mg Oral q1800  . DISCONTD: glimepiride  4 mg Oral BID WC  . DISCONTD: insulin aspart  0-15 Units Subcutaneous Q4H   Continuous Infusions:    . sodium chloride 20 mL/hr at 09/23/11 2311  . DISCONTD: dextrose 5 % and 0.9% NaCl 1,000 mL (09/23/11 0903)   PRN Meds:.HYDROcodone-acetaminophen, HYDROmorphone, methocarbamol (ROBAXIN) IV, methocarbamol, metoCLOPramide (REGLAN) injection, metoCLOPramide, ondansetron (ZOFRAN) IV, ondansetron, oxyCODONE, oxyCODONE-acetaminophen, DISCONTD: HYDROmorphone  Anti-infectives:  Anti-infectives     Start     Dose/Rate Route Frequency Ordered Stop   09/24/11 0030   ceFAZolin (ANCEF) IVPB 2  g/50 mL premix        2 g 100 mL/hr over 30 Minutes Intravenous Every 6 hours 09/23/11 2217 09/24/11 1232   09/23/11 1232   ceFAZolin (ANCEF) IVPB 2 g/50 mL premix        2 g 100 mL/hr over 30 Minutes Intravenous 60 min pre-op 09/23/11 1232 09/23/11 1831          Assessment/Plan: Principal Problem:  Present on Admission:  Possible bilateral quadriceps tendon ruptures. S/P surgery 09/23/11.  Will follow up with Dr Lajoyce Corners in two weeks.  [facility] requested to evaluate for admission into program.  Leukocytosis -Not unexpected after surgery.  Finishing Ancef dosing from surgery  .HTN (hypertension) BP slightly elevated, on lisinopril and HCTZ.  Will monitor.  .DM (diabetes mellitus) Will resume metformin at d/c.  On amaryl and sliding scale ac/hs.  Carb modified diet.  .Hyperlipidemia  On statin  *DVT Prophylaxis Was on  heparin this morning.  Held after 10 am dose today for possible surgery.Will need to resume in am.  Code Status Full Code  Disposition CIR evaluation in progress.  Stephani Police 09/25/2011, 7:44 AM [phone]  Addendum  Patient seen and examined, chart and data base reviewed.  I agree with the above assessment and plan  For full details please see Mrs. Algis Downs PA. Note.  Clint Lipps Pager: [phone] 10/11/2011, 7:07 AM

## 2011-09-25 NOTE — Progress Notes (Signed)
Patient ID: [name] [name], male   DOB: [birth year], 56 y.o.   MRN: [id number] Slow progress with PT, plan for inpatient rehab. Will get bledsoe braces for patient's legs

## 2011-09-25 NOTE — Plan of Care (Signed)
Overall Plan of Care [facility]) Patient Details Name: [name] [name] MRN: [id number] DOB: 1955/12/05  Diagnosis:  Bilateral quadriceps ruptures  Primary Diagnosis:    <principal problem not specified> Co-morbidities: Morbid obesity, pain  Functional Problem List  Patient demonstrates impairments in the following areas: Bladder, Bowel, Endurance, Medication Management, Nutrition, Pain, Safety and Skin Integrity  Basic ADL's: grooming, bathing, dressing and toileting Advanced ADL's: simple meal preparation  Transfers:  bed mobility, bed to chair, toilet, tub/shower, car and furniture Locomotion:  ambulation, wheelchair mobility and stairs  Additional Impairments:  None  Anticipated Outcomes Item Anticipated Outcome  Eating/Swallowing  independent  Basic self-care  supervision  Tolieting  Modified independent  Bowel/Bladder  Modified independent  Transfers    Locomotion    Communication    Cognition    Pain  4 or less on scale of 1-10  Safety/Judgment  supervision  Other     Therapy Plan:  1-2 x per day 5-7 days a week for occupational therapy       Team Interventions: Item RN PT OT SLP SW TR Other  Self Care/Advanced ADL Retraining   x      Neuromuscular Re-Education   x      Therapeutic Activities  x x      UE/LE Strength Training/ROM  x x      UE/LE Coordination Activities   x      Visual/Perceptual Remediation/Compensation         DME/Adaptive Equipment Instruction  x x      Therapeutic Exercise  x x      Balance/Vestibular Training  x x      Patient/Family Education x x x      Cognitive Remediation/Compensation         Functional Mobility Training  x x      Ambulation/Gait Training  x       Database administrator Reintegration  x x      Dysphagia/Aspiration Film/video editor         Bladder Management x        Bowel  Management x        Disease Management/Prevention x        Pain Management x x x      Medication Management x        Skin Care/Wound Management x        Splinting/Orthotics x x       Discharge Planning x  x  x    Psychosocial Support     x                       Team Discharge Planning: Destination:  Home to sister's house Projected Follow-up:  PT and TBD, HHOT  Projected Equipment Needs:  Environmental consultant, Wheelchair and wide RW, TBD if needs hospital bed, none for OT Patient/family involved in discharge planning:  Yes  MD ELOS: 7-10 days Medical Rehab Prognosis:  Excellent Assessment: Patient has been admitted for inpatient rehabilitation to address functional mobility, gait, adaptive equipment training in techniques, safety and pain management. He will have gait as well as wheelchair mobility goals. Goals essentially set modified independent to supervision.

## 2011-09-25 NOTE — Progress Notes (Signed)
09/25/2011 Casilda Pickerill Elizabeth PTA [phone] pager [phone] office    

## 2011-09-25 NOTE — Progress Notes (Signed)
I met with patient at bedside. He is in agreement to CIR/inpatient acute rehabilitation admit today. Workers Comp has given approval. I have contacted RN CM and Attending MD and we will facilitate admission today. Please call for any questions. Pager [phone]

## 2011-09-25 NOTE — Progress Notes (Signed)
Pt discharged to CIR per MD order.  Report called to [place], Charity fundraiser.  Pt's IV was left in the right [facility] for use in rehab.  All belongings sent with pt and all questions answered.  Pt transported in wheelchair by RN to unit 4000.

## 2011-09-25 NOTE — Progress Notes (Signed)
Orthopedic Tech Progress Note Patient Details:  [name] [name] 21-Mar-1956 [id number]  Patient ID: [name] [name], male   DOB: August 08, 1955, 56 y.o.   MRN: [id number]  Contacted [employer] for brace order.  Leo Grosser T 09/25/2011, 8:47 AM

## 2011-09-25 NOTE — H&P (Signed)
Physical Medicine and Rehabilitation Admission H&P  Chief Complaint   Patient presents with   .  Fall   .  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 He felt a pop on his right knee first, and subsequently, felt another pop in his left knee. He has pain proximal to both knees now. Evaluation in the emergency room on 04/01 included plain films of both the right and left knees, with soft tissue swelling suggestive of quadricep ruptures bilaterally. Dr. Lajoyce Corners consulted and patient underwent quad tendon repair on 04/01 pm. Post op WBAT and bledsoe braces to be on for ambulation/ and to avoid ROM. Patient noted to have low grade post op fever with leucocytosis. UA ordered and antibiotics initiated empirically.  Review of Systems  Eyes: Negative for blurred vision and double vision.  Respiratory: Negative for cough and shortness of breath.  Cardiovascular: Negative for chest pain and palpitations.  Gastrointestinal: Positive for constipation. Negative for nausea and abdominal pain.  Genitourinary: Negative for urgency and frequency.  Musculoskeletal: Positive for joint pain.  Neurological: Negative for sensory change, focal weakness and headaches.   Past Medical History   Diagnosis  Date   .  Hypertension    .  Diabetes mellitus     History reviewed. No pertinent past surgical history.  Family History   Problem  Relation  Age of Onset   .  Lung cancer  Father    .  Heart disease  Father    .  Heart disease  Brother     Social History: Lives alone. Works as a Naval architect. He reports that he smoked for about a year-quit 30 yrs ago. He does not have any smokeless tobacco history on file. He reports that he does not drink alcohol or use illicit drugs. Plans on d/c to sister and brothe-in-law's home. They don't work and can assist past discharge    Allergies: No Known Allergies  Scheduled Meds:  .  ceFAZolin (ANCEF) IV  2 g  Intravenous  Q6H   .  cefUROXime  500 mg  Oral  BID WC   .  docusate sodium  100 mg  Oral  BID   .  lisinopril  20 mg  Oral  Daily    And   .  hydrochlorothiazide  25 mg  Oral  Daily   .  insulin aspart  0-15 Units  Subcutaneous  TID WC   .  insulin aspart  0-5 Units  Subcutaneous  QHS   .  insulin aspart  4 Units  Subcutaneous  TID WC   .  senna-docusate  1 tablet  Oral  Once   .  senna-docusate  1 tablet  Oral  QHS   .  simvastatin  20 mg  Oral  q1800   .  DISCONTD: glimepiride  4 mg  Oral  BID WC   .  DISCONTD: insulin aspart  0-15 Units  Subcutaneous  Q4H    Continuous Infusions:  .  sodium chloride  20 mL/hr at 09/23/11 2311    PRN Meds:.HYDROcodone-acetaminophen, HYDROmorphone, methocarbamol (ROBAXIN) IV, methocarbamol, metoCLOPramide (REGLAN) injection, metoCLOPramide, ondansetron (ZOFRAN) IV, ondansetron, oxyCODONE, oxyCODONE-acetaminophen  Medications Prior to Admission   Medication  Sig  Dispense  Refill   .  glimepiride (AMARYL) 4 MG tablet  Take 4 mg by mouth 2 (  two) times daily.     Marland Kitchen  lisinopril-hydrochlorothiazide (PRINZIDE,ZESTORETIC) 20-25 MG per tablet  Take 1 tablet by mouth daily.     .  simvastatin (ZOCOR) 20 MG tablet  Take 20 mg by mouth every evening.     Home:  Home Living  Lives With: Alone  Type of Home: Other (Comment) (condo)  Home Layout: One level  Home Access: Level entry  Bathroom Shower/Tub: Pension scheme manager: Standard  Bathroom Accessibility: Yes  How Accessible: Accessible via walker  Home Adaptive Equipment: None;Bedside commode/3-in-1;Walker - rolling  Additional Comments: Equipment is his brother-in-laws  Functional History:  Prior Function  Level of Independence: Independent with basic ADLs;Independent with transfers;Independent with homemaking with ambulation;Independent with gait  Able to Take Stairs?: Yes  Driving: Yes  Vocation: Full time  employment  Functional Status:  Mobility:  Bed Mobility  Bed Mobility: Yes  Supine to Sit: 1: +1 Total assist;HOB elevated (Comment degrees)  Supine to Sit Details (indicate cue type and reason): HOB 25 degrees  Sitting - Scoot to Edge of Bed: 2: Max assist  Sitting - Scoot to [place] of Bed Details (indicate cue type and reason): assist to support legs  Sit to Supine: 2: Max assist  Sit to Supine - Details (indicate cue type and reason): To help lift LEs up into the bed.  Scooting to [facility]: 5: Supervision;With rail;Other (comment) (bed in trendelenburg)  Transfers  Sit to Stand: 3: Mod assist;Other (comment);From bed;From elevated surface  Sit to Stand Details (indicate cue type and reason): Bed raised to help increase sit to stand.  Stand to Sit: 3: Mod assist;Without upper extremity assist  Stand to Sit Details: Needs assist to slide LEs forward to sit.  Ambulation/Gait  Ambulation/Gait Assistance: 4: Min assist  Ambulation/Gait Assistance Details (indicate cue type and reason): Pt sidestepped 3 feet along side of bed. Pt slightly dizzy so didn't attempt further amb  Ambulation Distance (Feet): 3 Feet  Assistive device: Rolling walker   ADL:  ADL  Eating/Feeding: Simulated;Independent  Where Assessed - Eating/Feeding: Edge of bed  Grooming: Simulated;Set up  Where Assessed - Grooming: Sitting, bed  Upper Body Bathing: Simulated;Set up  Where Assessed - Upper Body Bathing: Unsupported;Sitting, bed  Lower Body Bathing: Simulated;Maximal assistance  Where Assessed - Lower Body Bathing: Sit to stand from bed  Upper Body Dressing: Simulated;Set up  Where Assessed - Upper Body Dressing: Sitting, bed  Lower Body Dressing: Simulated;Maximal assistance  Where Assessed - Lower Body Dressing: Sit to stand from bed  Toilet Transfer: Simulated;Moderate assistance  Toilet Transfer Method: Engineer, drilling: Other (comment) (simulated EOB. Pt declined need to toilet.)    Toileting - Clothing Manipulation: Simulated;Moderate assistance  Where Assessed - Toileting Clothing Manipulation: Sit to stand from 3-in-1 or toilet  Toileting - Hygiene: Simulated;Moderate assistance  Where Assessed - Toileting Hygiene: Sit to stand from 3-in-1 or toilet  Tub/Shower Transfer: Not assessed  Tub/Shower Transfer Method: Not assessed  Equipment Used: Rolling walker  Ambulation Related to ADLs: Pt min assist for taking small steps using the RW.  ADL Comments: Pt will need education on AE for LB selfcare secondary to not having enough flexibilty in his trunk to reach his feet for LB selfcare. Exhibits moderate difficulty with sit to stand which is to be expected based on the fact that he has 2 KIs.  Cognition:  Cognition  Arousal/Alertness: Awake/alert  Orientation Level: Oriented X4  Cognition  Arousal/Alertness: Awake/alert  Overall Cognitive Status:  Appears within functional limits for tasks assessed  Orientation Level: Oriented X4  Blood pressure 131/80, pulse 93, temperature 98.3 F (36.8 C), temperature source Oral, resp. rate 17, height 5\' 8"  (1.727 m), weight 117.6 kg (259 lb 4.2 oz), SpO2 93.00%.  Physical Exam  Constitutional: He is oriented to person, place, and time. He appears well-developed and well-nourished. Morbidly obese HENT:  Head: Normocephalic and atraumatic.  Eyes: Pupils are equal, round, and reactive to light. EOMI Neck: Normal range of motion. Neck supple.  Cardiovascular: Regular rhythm. Tachycardia present. No M,R,G Pulmonary/Chest: Effort normal and breath sounds normal.  Abdominal: Soft. Bowel sounds are normal. He exhibits no distension.  Musculoskeletal: He exhibits edema.  Edema noted at thighs. BLE with Bledsoe brace in place with surgical dressing and knees in full extension.  Neurological: He is alert and oriented to person, place, and time. Strength and sensation are within normal limits given orthopedic issues. Skin: Skin is dry. Op  sites currently covered  Results for orders placed during the hospital encounter of 09/22/11 (from the past 48 hour(s))   GLUCOSE, CAPILLARY Status: Abnormal    Collection Time    09/23/11 12:09 PM   Component  Value  Range  Comment    Glucose-Capillary  213 (*)  70 - 99 (mg/dL)    SURGICAL PCR SCREEN Status: Normal    Collection Time    09/23/11 12:40 PM   Component  Value  Range  Comment    MRSA, PCR  NEGATIVE  NEGATIVE     Staphylococcus aureus  NEGATIVE  NEGATIVE    GLUCOSE, CAPILLARY Status: Abnormal    Collection Time    09/23/11 3:46 PM   Component  Value  Range  Comment    Glucose-Capillary  171 (*)  70 - 99 (mg/dL)    GLUCOSE, CAPILLARY Status: Abnormal    Collection Time    09/23/11 7:29 PM   Component  Value  Range  Comment    Glucose-Capillary  141 (*)  70 - 99 (mg/dL)    GLUCOSE, CAPILLARY Status: Abnormal    Collection Time    09/23/11 10:29 PM   Component  Value  Range  Comment    Glucose-Capillary  183 (*)  70 - 99 (mg/dL)     Comment 1  Notify RN      Comment 2  Documented in Chart     GLUCOSE, CAPILLARY Status: Abnormal    Collection Time    09/24/11 12:55 AM   Component  Value  Range  Comment    Glucose-Capillary  167 (*)  70 - 99 (mg/dL)     Comment 1  Notify RN     GLUCOSE, CAPILLARY Status: Abnormal    Collection Time    09/24/11 4:27 AM   Component  Value  Range  Comment    Glucose-Capillary  178 (*)  70 - 99 (mg/dL)    CBC Status: Abnormal    Collection Time    09/24/11 5:42 AM   Component  Value  Range  Comment    WBC  15.7 (*)  4.0 - 10.5 (K/uL)     RBC  4.30  4.22 - 5.81 (MIL/uL)     Hemoglobin  13.0  13.0 - 17.0 (g/dL)     HCT  13.2  44.0 - 52.0 (%)     MCV  92.8  78.0 - 100.0 (fL)     MCH  30.2  26.0 - 34.0 (pg)     MCHC  32.6  30.0 - 36.0 (g/dL)     RDW  91.4  78.2 - 15.5 (%)     Platelets  360  150 - 400 (K/uL)    BASIC METABOLIC PANEL Status: Abnormal    Collection Time    09/24/11 5:42 AM   Component  Value  Range  Comment    Sodium  137  135  - 145 (mEq/L)     Potassium  4.0  3.5 - 5.1 (mEq/L)     Chloride  99  96 - 112 (mEq/L)     CO2  29  19 - 32 (mEq/L)     Glucose, Bld  189 (*)  70 - 99 (mg/dL)     BUN  14  6 - 23 (mg/dL)     Creatinine, Ser  9.56  0.50 - 1.35 (mg/dL)     Calcium  8.6  8.4 - 10.5 (mg/dL)     GFR calc non Af Amer  >90  >90 (mL/min)     GFR calc Af Amer  >90  >90 (mL/min)    GLUCOSE, CAPILLARY Status: Abnormal    Collection Time    09/24/11 7:42 AM   Component  Value  Range  Comment    Glucose-Capillary  176 (*)  70 - 99 (mg/dL)    GLUCOSE, CAPILLARY Status: Abnormal    Collection Time    09/24/11 11:27 AM   Component  Value  Range  Comment    Glucose-Capillary  188 (*)  70 - 99 (mg/dL)    GLUCOSE, CAPILLARY Status: Abnormal    Collection Time    09/24/11 5:12 PM   Component  Value  Range  Comment    Glucose-Capillary  191 (*)  70 - 99 (mg/dL)    GLUCOSE, CAPILLARY Status: Abnormal    Collection Time    09/24/11 9:53 PM   Component  Value  Range  Comment    Glucose-Capillary  190 (*)  70 - 99 (mg/dL)    CBC Status: Abnormal    Collection Time    09/25/11 6:30 AM   Component  Value  Range  Comment    WBC  18.2 (*)  4.0 - 10.5 (K/uL)     RBC  4.13 (*)  4.22 - 5.81 (MIL/uL)     Hemoglobin  12.5 (*)  13.0 - 17.0 (g/dL)     HCT  21.3 (*)  08.6 - 52.0 (%)     MCV  92.3  78.0 - 100.0 (fL)     MCH  30.3  26.0 - 34.0 (pg)     MCHC  32.8  30.0 - 36.0 (g/dL)     RDW  57.8  46.9 - 15.5 (%)     Platelets  376  150 - 400 (K/uL)    GLUCOSE, CAPILLARY Status: Abnormal    Collection Time    09/25/11 7:40 AM   Component  Value  Range  Comment    Glucose-Capillary  165 (*)  70 - 99 (mg/dL)     No results found.  Post Admission Physician Evaluation:  1. Functional deficits secondary to bilateral quad tendon rupture and repair.. 2. Patient is admitted to receive collaborative, interdisciplinary care between the physiatrist, rehab nursing staff, and therapy team. 3. Patient's level of medical complexity and  substantial therapy needs in context of that medical necessity cannot be provided at a lesser intensity of care such as a SNF. 4. Patient has experienced substantial functional loss from his/her baseline which was documented above  under the "Functional History" and "Functional Status" headings. Judging by the patient's diagnosis, physical exam, and functional history, the patient has potential for functional progress which will result in measurable gains while on inpatient rehab. These gains will be of substantial and practical use upon discharge in facilitating mobility and self-care at the household level. 5. Physiatrist will provide 24 hour management of medical needs as well as oversight of the therapy plan/treatment and provide guidance as appropriate regarding the interaction of the two. 6. 24 hour rehab nursing will assist with bladder management, bowel management, safety, skin/wound care, disease management, medication administration, pain management and patient education and help integrate therapy concepts, techniques,education, etc. 7. PT will assess and treat for: lower extremity, strength, mobility with KI's, pain mgt, adaptive equipment training. Goals are: mod I for short dx gait and wheelchair mobility. 8. OT will assess and treat for: UES, ROM, safety, ADL's, fxnl mobility. Goals are: Mod I to set up. 9. SLP will assess and treat for: not app 10. Case Management and Social Worker will assess and treat for psychological issues and discharge planning. 11. Team conference will be held weekly to assess progress toward goals and to determine barriers to discharge. 12. Patient will receive at least 3 hours of therapy per day at least 5 days per week. 13. ELOS and Prognosis: 7-10 days excellent  Medical Problem List and Plan:  1. DVT Prophylaxis/Anticoagulation: Pharmaceutical: Lovenox  2. Pain Management: will schedule meds prior to therapies as currently in constant pain. Advise patient to  take meds to keep pain under control.  3. Mood: Monitor for now. No signs of depression.  4. FUO: ? Post op v/s UTI. Encourage IS every hour. Continue antibiotics. Await UCS. Check dopplers as has not been on anticoagulation and with decreased mobility.  5. DM type 2: monitor BS with ac/hs cbg checks and use SSI to keep BS under control and promote healing. 6.HTN: Monitor with bid checks. Will adjust for better control if BP remains elevated. Resume amaryl and monitor for need for meal coverage.  7. ABLA: Will monitor for now. Add iron supplement.  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 Will monitor trend to see if this improves with antibiotic on board.  9. Dyslipidemia: continue Zocor  Ranelle Oyster, MD 09/25/11

## 2011-09-25 NOTE — Progress Notes (Signed)
Physical Therapy Treatment Patient Details Name: [name] [name] MRN: [id number] DOB: Jul 31, 1955 Today's Date: 09/25/2011  PT Assessment/Plan  PT - Assessment/Plan Comments on Treatment Session: Pt was able progress with amb. Pt very motativated. Plan to DC to CIR this afternoon  PT Plan: Discharge plan remains appropriate PT Frequency: Min 5X/week Follow Up Recommendations: Inpatient Rehab Equipment Recommended: Defer to next venue PT Goals  Acute Rehab PT Goals PT Goal: Supine/Side to Sit - Progress: Progressing toward goal PT Goal: Sit to Supine/Side - Progress: Progressing toward goal PT Goal: Sit to Stand - Progress: Progressing toward goal PT Goal: Stand to Sit - Progress: Progressing toward goal PT Goal: Ambulate - Progress: Progressing toward goal  PT Treatment Precautions/Restrictions  Precautions Required Braces or Orthoses: Yes Knee Immobilizer: On at all times Restrictions Weight Bearing Restrictions: Yes RLE Weight Bearing: Weight bearing as tolerated LLE Weight Bearing: Weight bearing as tolerated Mobility (including Balance) Bed Mobility Supine to Sit: 1: +2 Total assist;Patient percentage (comment);HOB elevated (Comment degrees) (patient =40%) Supine to Sit Details (indicate cue type and reason): Pt required assistance to upright position in bed with HOB elevated. Pt required assistance with leg placment. Pt able to start moving legs towards EOB  Sitting - Scoot to Edge of Bed: 2: Max assist Sitting - Scoot to [place] of Bed Details (indicate cue type and reason): Pt required assistance  with usage of pad. VC for efficient techquie  Transfers Sit to Stand: 1: +2 Total assist;From bed;With upper extremity assist;Patient percentage (comment) (Patient= 50% ) Sit to Stand Details (indicate cue type and reason): Pt required VC for hand placement on walker during ascent. Assistance required for initiate stand and ensure balance  Stand to Sit: 1: +2 Total assist;To  chair/3-in-1;4: Min assist;With armrests;With upper extremity assist;Patient percentage (comment) (Patient=75%) Stand to Sit Details: Pt required VC to use arm rest and to control descent.  Ambulation/Gait Ambulation/Gait: Yes Ambulation/Gait Assistance: 1: +2 Total assist;Patient percentage (comment) (patient=75%) Ambulation/Gait Assistance Details (indicate cue type and reason): Pt required VC to adjust the amount of pressure  on walker/arms. VC to correct flexed trunk  . Assistance for safety and balance  Ambulation Distance (Feet): 40 Feet Assistive device: Rolling walker Gait Pattern: Trunk flexed;Step-to pattern;Decreased stride length;Shuffle Gait velocity: decreased  Stairs: No Wheelchair Mobility Wheelchair Mobility: No    Exercise    End of Session PT - End of Session Equipment Utilized During Treatment: Gait belt;Other (comment) (B Bledsoe brace) Activity Tolerance: Patient tolerated treatment well Patient left: in chair;with call bell in reach Nurse Communication: Mobility status for ambulation;Mobility status for transfers General Behavior During Session: [facility] for tasks performed Cognition: [facility] for tasks performed  [place], Stockard, SPTA 09/25/2011, 11:46 AM

## 2011-09-26 DIAGNOSIS — M66259 Spontaneous rupture of extensor tendons, unspecified thigh: Secondary | ICD-10-CM

## 2011-09-26 DIAGNOSIS — E669 Obesity, unspecified: Secondary | ICD-10-CM

## 2011-09-26 DIAGNOSIS — Z5189 Encounter for other specified aftercare: Secondary | ICD-10-CM

## 2011-09-26 DIAGNOSIS — M7989 Other specified soft tissue disorders: Secondary | ICD-10-CM

## 2011-09-26 LAB — DIFFERENTIAL
Basophils Relative: 0 % (ref 0–1)
Eosinophils Absolute: 0.4 10*3/uL (ref 0.0–0.7)
Monocytes Relative: 11 % (ref 3–12)
Neutro Abs: 12.7 10*3/uL — ABNORMAL HIGH (ref 1.7–7.7)
Neutrophils Relative %: 72 % (ref 43–77)

## 2011-09-26 LAB — CBC
HCT: 38.5 % — ABNORMAL LOW (ref 39.0–52.0)
Hemoglobin: 12.9 g/dL — ABNORMAL LOW (ref 13.0–17.0)
MCHC: 33.5 g/dL (ref 30.0–36.0)
MCV: 90 fL (ref 78.0–100.0)
RDW: 12.5 % (ref 11.5–15.5)

## 2011-09-26 LAB — COMPREHENSIVE METABOLIC PANEL
Albumin: 3 g/dL — ABNORMAL LOW (ref 3.5–5.2)
BUN: 19 mg/dL (ref 6–23)
Chloride: 94 mEq/L — ABNORMAL LOW (ref 96–112)
Creatinine, Ser: 0.83 mg/dL (ref 0.50–1.35)
GFR calc Af Amer: >90 mL/min (ref >90)
Glucose, Bld: 202 mg/dL — ABNORMAL HIGH (ref 70–99)
Total Bilirubin: 0.5 mg/dL (ref 0.3–1.2)
Total Protein: 6.8 g/dL (ref 6.0–8.3)

## 2011-09-26 LAB — GLUCOSE, CAPILLARY: Glucose-Capillary: 228 mg/dL — ABNORMAL HIGH (ref 70–99)

## 2011-09-26 MED ORDER — OXYCODONE HCL 5 MG PO TABS
10.0000 mg | ORAL_TABLET | Freq: Two times a day (BID) | ORAL | Status: DC
  Administered 2011-09-26 – 2011-10-02 (×12): 10 mg via ORAL
  Filled 2011-09-26 (×10): qty 2

## 2011-09-26 MED ORDER — TRAZODONE HCL 50 MG PO TABS
50.0000 mg | ORAL_TABLET | Freq: Every day | ORAL | Status: DC
  Administered 2011-09-26: 50 mg via ORAL
  Filled 2011-09-26: qty 1

## 2011-09-26 MED ORDER — OXYCODONE HCL 5 MG PO TABS: 10.0000 mg | ORAL_TABLET | Freq: Two times a day (BID) | ORAL | Status: DC

## 2011-09-26 NOTE — Progress Notes (Signed)
Physical Therapy Assessment and Plan  Patient Details  Name: [name] [name] MRN: [id number] Date of Birth: [birth year]  PT Diagnosis: Difficulty walking, Muscle weakness and Pain in B knees Rehab Potential: Good ELOS: 10 days   Today's Date: 09/26/2011 Time: 0805-0905 Time Calculation (min): 60 min  Problem List:  Patient Active Problem List  Diagnoses  . Quadriceps tendon rupture  . HTN (hypertension)  . DM (diabetes mellitus)  . Hyperlipidemia  . Obesity, Class III, BMI 40-49.9 (morbid obesity)    Past Medical History:  Past Medical History  Diagnosis Date  . Hypertension   . Diabetes mellitus    Past Surgical History:  Past Surgical History  Procedure Date  . Quadriceps tendon repair 09/23/2011    Procedure: REPAIR QUADRICEP TENDON;  Surgeon: Nadara Mustard, MD;  Location: [facility] OR;  Service: Orthopedics;  Laterality: Bilateral;  Bilateral Quadriceps Tendon Repair    Assessment & Plan Clinical Impression: Patient is a 56 y.o. year old male with recent admission to the hospital on 09/23/11 with knee pain after fall and hearing pop. Had B quadricep tendon tears and underwent surgical repair.  Patient transferred to CIR on 09/25/2011 .   Patient currently requires total A for bed mobility, total to max A for transfers depending on surface height and min A with gait with RW. with mobility secondary to muscle weakness and ROM restrictions as knees locked in full extension in bledsoe braces., decreased cardiorespiratoy endurance and decreased standing balance.  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  Home access is  Level entry.  Patient will benefit from skilled PT intervention to maximize safe functional mobility, minimize fall risk and decrease caregiver burden for planned discharge home with 24 hour may need A d/t braces but min A at most with goal mod I once up on feet..   Anticipate patient will F/U TBD based on progress at discharge.  PT - End of Session Activity Tolerance: Tolerates 30+ min activity with multiple rests Endurance Deficit: Yes PT Assessment Rehab Potential: Good Barriers to Discharge: None PT Plan PT Frequency: 2-3 X/day, 60-90 minutes Estimated Length of Stay: 10 days PT Treatment/Interventions: Ambulation/gait training;Community reintegration;Balance/vestibular training;Discharge planning;Pain management;Splinting/orthotics;DME/adaptive equipment instruction;UE/LE Strength taining/ROM;Patient/family education;Stair training;Functional mobility training;Therapeutic Activities;Therapeutic Exercise;Wheelchair propulsion/positioning PT Recommendation Follow Up Recommendations: Home health PT;Outpatient PT (TBD) Equipment Recommended: Rolling walker with 5" wheels;Wheelchair (measurements);Wheelchair cushion (measurements) Equipment Details: wide RW, w/c with ELR  PT Evaluation Precautions/Restrictions Precautions Precautions: Fall Required Braces or Orthoses: Yes Knee Immobilizer: On at all times Restrictions Weight Bearing Restrictions: No RLE Weight Bearing: Weight bearing as tolerated LLE Weight Bearing: Weight bearing as tolerated Other Position/Activity Restrictions: bledsoe braces locked in full extension Vital Signs HR 140 with gait   Pain Pain Assessment Pain Assessment: 0-10 Pain Score:   8 Pain Type: Surgical pain Pain Location: Leg Pain Orientation: Right;Left Pain Descriptors: Aching Pain Frequency: Constant Pain Onset: On-going Nurse informed, ptpain being managed with meds, rest breaks and repositioning Home Living/Prior Functioning  I, lived alone Vision/Perception  Vision - History Baseline Vision: Wears glasses all the time  Cognition Overall Cognitive Status: Appears within functional limits for tasks assessed Arousal/Alertness: Awake/alert Orientation Level: Oriented X4 Sensation Sensation Light  Touch: Appears Intact Proprioception: Appears Intact Coordination Gross Motor Movements are Fluid and Coordinated: Yes Motor  Motor Motor: Abnormal postural alignment and control Motor - Skilled Clinical Observations: postural control  impaired by knees being locked in extension  Locomotion  Ambulation Ambulation/Gait Assistance: 4: Min assist Gait Gait velocity: .2 ft/sec  Trunk/Postural Assessment  Cervical Assessment Cervical Assessment: Within Functional Limits Thoracic Assessment Thoracic Assessment: Within Functional Limits Lumbar Assessment Lumbar Assessment: Within Functional Limits  Balance Balance Balance Assessed: Yes Static Sitting Balance Static Sitting - Level of Assistance: 6: Modified independent (Device/Increase time) (props on UE's) Dynamic Sitting Balance Dynamic Sitting - Level of Assistance: 5: Stand by assistance (feet dont touch floor d/t size and braces) Static Standing Balance Static Standing - Level of Assistance: 5: Stand by assistance (without UE assist) Dynamic Standing Balance Dynamic Standing - Level of Assistance: 5: Stand by assistance (with 1 UE assist) Extremity Assessment      RLE Assessment RLE Assessment: Exceptions to [facility] RLE PROM (degrees) RLE Overall PROM Comments: hip and ankle functional although hip limited by leg position and pain, knee locked in extension RLE Strength RLE Overall Strength Comments: hip 2-/5 abduction/adduction, total A to SLR, ankle WFL LLE Assessment LLE Assessment: Exceptions to WFL LLE PROM (degrees) LLE Overall PROM Comments: hip and ankle functional although hip ROM limited by position of locked knee and pain, knee locked in extension LLE Strength LLE Overall Strength Comments: hip abduction/adduction 2-/5, total A to SLR, ankle WFL- pt reports this leg feels stronger than R  See FIM for current functional status Refer to Care Plan for Long Term Goals  Recommendations for other services:  None  Discharge Criteria: Patient will be discharged from PT if patient refuses treatment 3 consecutive times without medical reason, if treatment goals not met, if there is a change in medical status, if patient makes no progress towards goals or if patient is discharged from hospital.  The above assessment, treatment plan, treatment alternatives and goals were discussed and mutually agreed upon: by patient  Skilled intervention initiated- RW instruction with gait training, pt began with step to pattern but progressed to step thru which improved velocity, impaired functional efficiency as gait increased HR to 140; frequent rest breaks required; discussed how transfers will be easier from higher surface and pt to have family measure bed height  Michaelene Song 09/26/2011, 12:19 PM

## 2011-09-26 NOTE — Progress Notes (Addendum)
Occupational Therapy Session Note  Patient Details  Name: [name] [name] MRN: [id number] Date of Birth: 1956/02/20  Today's Date: 09/26/2011 Time: 4098-1191 Time Calculation (min): 30 min  Short Term Goals: Week 1:  OT Short Term Goal 1 (Week 1): Short term goals equal to long terms goals set at supervision to modified independent.  Skilled Therapeutic Interventions/Progress Updates:  1:1 focus on bed mobility/transitional mobility on mat sitting <supine<into long sitting<supine<sitting EOM, sit to stands and stands to sit. Pt needed a with bilateral LEs to lay down and support of right LE to sit up with min A for trunk/core.  Practiced long sitting for hamstring stretch and to work on getting into a position to assist with adjusting braces and bathing/ dressing LEs in bed. Spoke with Dr. Riley Kill and he said braces could come undone to wash LEs as long as LE was FULLY supported by the bed. Also practiced stepping over thresholds (used simulated shower stall box) sideways and then backwards then forwards with steadying Assist. After discussion pt decided backwards/ forwards required less maneuvering the RW.   Therapy Documentation Precautions:  Precautions Precautions: Fall Precaution Comments: knees locked in full extension Required Braces or Orthoses: Yes Knee Immobilizer: On at all times Restrictions Weight Bearing Restrictions: No RLE Weight Bearing: Weight bearing as tolerated LLE Weight Bearing: Weight bearing as tolerated Other Position/Activity Restrictions: bledsoe braces locked in full extension Pain: Pain Assessment Pain Assessment: 0-10 Pain Score:   4 Pain Location: Knee Pain Orientation: Right;Left Pain Onset: On-going Pain Intervention(s): RN made aware;Rest;Repositioned (premedicated) Multiple Pain Sites: No  See FIM for current functional status  Therapy/Group: Individual Therapy  Roney Mans [place] 09/26/2011, 3:31 PM

## 2011-09-26 NOTE — Evaluation (Addendum)
Occupational Therapy Assessment and Plan  Patient Details  Name: [name] [name] MRN: [id number] Date of Birth: June 17, 1956  OT Diagnosis: acute pain and muscle weakness (generalized) Rehab Potential: Rehab Potential: Excellent ELOS:     Today's Date: 09/26/2011 Time: 4098-1191 Time Calculation (min): 60 min  Problem List:  Patient Active Problem List  Diagnoses  . Quadriceps tendon rupture  . HTN (hypertension)  . DM (diabetes mellitus)  . Hyperlipidemia  . Obesity, Class III, BMI 40-49.9 (morbid obesity)    Past Medical History:  Past Medical History  Diagnosis Date  . Hypertension   . Diabetes mellitus    Past Surgical History:  Past Surgical History  Procedure Date  . Quadriceps tendon repair 09/23/2011    Procedure: REPAIR QUADRICEP TENDON;  Surgeon: Nadara Mustard, MD;  Location: [facility] OR;  Service: Orthopedics;  Laterality: Bilateral;  Bilateral Quadriceps Tendon Repair    Assessment & Plan Clinical Impression: Patient is a 56 y.o. year old male with recent admission to the hospital on 09/23/11 with bilateral quad tendon tears..  Patient transferred to CIR on 09/25/2011 .    Patient currently requires max with basic self-care skills secondary to muscle weakness.  Prior to hospitalization, patient could complete all ADLs with independence.  Patient will benefit from skilled intervention to decrease level of assist with basic self-care skills, increase independence with basic self-care skills and increase level of independence with iADL prior to discharge to his sisters house with PRN supervision..  Anticipate patient will require intermittent supervision and follow up home health.  OT - End of Session Activity Tolerance: Tolerates 30+ min activity with multiple rests Endurance Deficit: Yes OT Assessment Rehab Potential: Excellent OT Plan OT Frequency: 1-2 X/day, 60-90 minutes OT Treatment/Interventions: Balance/vestibular training;DME/adaptive equipment instruction;Community  reintegration;Discharge planning;Functional mobility training;Therapeutic Activities;UE/LE Strength taining/ROM;Therapeutic Exercise;Patient/family education OT Recommendation Follow Up Recommendations: Home health OT Equipment Recommended: None recommended by OT  OT Evaluation Precautions/Restrictions  Precautions Precautions: Fall Required Braces or Orthoses: Yes Knee Immobilizer: On at all times Restrictions Weight Bearing Restrictions: No RLE Weight Bearing: Weight bearing as tolerated LLE Weight Bearing: Weight bearing as tolerated Other Position/Activity Restrictions: bledsoe braces locked in full extension   Pain Pain Assessment Pain Score:   3 Pain Location: Leg Pain Orientation: Right;Left Pain Intervention(s): Medication (See eMAR);Repositioned (Pre medicated prior to sessison.) Multiple Pain Sites: No Home Living/Prior Functioning Home Living Lives With:  (to d/c to sisters home) Type of Home: House Home Layout: One level Home Access: Level entry Bathroom Shower/Tub: Walk-in shower (6 inch lip) Home Adaptive Equipment: Crutches;Bedside commode/3-in-1 Additional Comments: siter owns walker, TBD if RW vs SW and some form of w/c Prior Function Level of Independence: Independent with homemaking with ambulation Able to Take Stairs?: Yes Driving: Yes Vocation: Full time employment Vocation Requirements: truck driver ADL  SEE FIM FOR FUNCTIONAL ADL LEVELS  Vision/Perception  Vision - History Baseline Vision: Wears glasses all the time Patient Visual Report: No change from baseline Vision - Assessment Eye Alignment: Within Functional Limits Perception Perception: Within Functional Limits Praxis Praxis: Intact  Cognition Overall Cognitive Status: Appears within functional limits for tasks assessed Arousal/Alertness: Awake/alert Orientation Level: Oriented X4 Sensation Sensation Light Touch: Appears Intact Stereognosis: Not tested Hot/Cold: Not  tested Proprioception: Not tested Coordination Gross Motor Movements are Fluid and Coordinated: Yes Fine Motor Movements are Fluid and Coordinated: Yes Motor  Motor Motor: Within Functional Limits Motor - Skilled Clinical Observations: postural control impaired by knees being locked in extension  Trunk/Postural Assessment  Cervical Assessment Cervical Assessment: Within Functional Limits Thoracic Assessment Thoracic Assessment: Within Functional Limits Lumbar Assessment Lumbar Assessment: Within Functional Limits Postural Control Postural Control: Within Functional Limits  Balance Balance Balance Assessed: Yes Static Sitting Balance Static Sitting - Level of Assistance: 6: Modified independent (Device/Increase time) Dynamic Sitting Balance Dynamic Sitting - Level of Assistance: 5: Stand by assistance Static Standing Balance Static Standing - Level of Assistance: 5: Stand by assistance Dynamic Standing Balance Dynamic Standing - Level of Assistance: 4: Min assist (min guard) Extremity/Trunk Assessment RUE Assessment RUE Assessment: Within Functional Limits LUE Assessment LUE Assessment: Within Functional Limits  See FIM for current functional status Refer to Care Plan for Long Term Goals  Recommendations for other services: None  Discharge Criteria: Patient will be discharged from OT if patient refuses treatment 3 consecutive times without medical reason, if treatment goals not met, if there is a change in medical status, if patient makes no progress towards goals or if patient is discharged from hospital.  The above assessment, treatment plan, treatment alternatives and goals were discussed and mutually agreed upon: by patient   Also began instruction on selfcare retraining and use of DME and AE to increase independence with basic selfcare tasks.  Freda Jaquith OTR/L 09/26/2011, 12:52 PM  Pager number [phone]

## 2011-09-26 NOTE — Progress Notes (Addendum)
[facility] Individual Statement of Services  Patient Name:  [name] [name]  Date:  09/26/2011  Welcome to the [facility].  Our goal is to provide you with an individualized program based on your diagnosis and situation, designed to meet your specific needs.  With this comprehensive rehabilitation program, you will be expected to participate in at least 3 hours of rehabilitation therapies Monday-Friday, with modified therapy programming on the weekends.  Your rehabilitation program will include the following services:  Physical Therapy (PT), Occupational Therapy (OT), 24 hour per day rehabilitation nursing, Therapeutic Recreaction (TR), Case Management (RN and Child psychotherapist), Rehabilitation Medicine, Nutrition Services and Pharmacy Services  Weekly team conferences will be held on Tuesdays to discuss your progress.  Your RN Case Designer, television/film set will talk with you frequently to get your input and to update you on team discussions.  Team conferences with you and your family in attendance may also be held.  Expected length of stay: about 10 days   Overall anticipated outcome: Minimum assistance - modified independent  Depending on your progress and recovery, your program may change.  Your RN Case Estate agent will coordinate services and will keep you informed of any changes.  Your RN Sports coach and SW names and contact numbers are listed  below.  The following services may also be recommended but are not provided by the [facility]:   Driving Evaluations  Home Health Rehabiltiation Services  Outpatient Rehabilitatation [facility]  Vocational Rehabilitation   Arrangements will be made to provide these services after discharge if needed.  Arrangements include referral to agencies that provide these services.  Your insurance has been verified to be:  Workers Comp Your primary doctor is:  Dr Jerl Mina  Pertinent information will be shared with your doctor and your insurance company.  Case Manager: Melanee Spry, [facility] [phone]  Social Worker:  [place], [place] [phone]  Information discussed with and copy given to patient by: Meryl Dare, 09/26/2011

## 2011-09-26 NOTE — Progress Notes (Addendum)
Patient ID: [name] [name], male   DOB: [birth year], 56 y.o.   MRN: [id number] Subjective/Complaints:   Objective: Vital Signs: Blood pressure 131/66, pulse 100, temperature 99 F (37.2 C), temperature source Oral, resp. rate 20, height 5\' 8"  (1.727 m), weight 126.554 kg (279 lb), SpO2 92.00%. No results found.  Basename 09/26/11 0604 09/25/11 0630  WBC 17.6* 18.2*  HGB 12.9* 12.5*  HCT 38.5* 38.1*  PLT 417* 376    Basename 09/26/11 0604 09/24/11 0542  NA 132* 137  K 3.7 4.0  CL 94* 99  CO2 26 29  GLUCOSE 202* 189*  BUN 19 14  CREATININE 0.83 0.77  CALCIUM 9.2 8.6   CBG (last 3)   Basename 09/26/11 0729 09/25/11 2108 09/25/11 1606  GLUCAP 228* 212* 184*    Wt Readings from Last 3 Encounters:  09/25/11 126.554 kg (279 lb)  09/23/11 117.6 kg (259 lb 4.2 oz)  09/23/11 117.6 kg (259 lb 4.2 oz)    Physical Exam:  General appearance: alert, cooperative and no distress Head: Normocephalic, without obvious abnormality, atraumatic Eyes: conjunctivae/corneas clear. PERRL, EOM's intact. Fundi benign. Ears: normal TM's and external ear canals both ears Nose: Nares normal. Septum midline. Mucosa normal. No drainage or sinus tenderness. Throat: lips, mucosa, and tongue normal; teeth and gums normal Neck: no adenopathy, no carotid bruit, no JVD, supple, symmetrical, trachea midline and thyroid not enlarged, symmetric, no tenderness/mass/nodules Back: symmetric, no curvature. ROM normal. No CVA tenderness. Resp: clear to auscultation bilaterally Cardio: regular rate and rhythm, S1, S2 normal, no murmur, click, rub or gallop GI: soft, non-tender; bowel sounds normal; no masses,  no organomegaly Extremities: extremities normal, atraumatic, no cyanosis or edema Pulses: 2+ and symmetric Skin: Skin color, texture, turgor normal. No rashes or lesions Neurologic: Grossly normal moves all 4's. No gross sensory loss. cogntively intact Incision/Wound: wounds dressed. In  KI's   Assessment/Plan: 1. Functional deficits secondary to bilateral quad tendon ruptures/morbid obesity which require 3+ hours per day of interdisciplinary therapy in a comprehensive inpatient rehab setting. Physiatrist is providing close team supervision and 24 hour management of active medical problems listed below. Physiatrist and rehab team continue to assess barriers to discharge/monitor patient progress toward functional and medical goals. FIM:       FIM - Toileting Toileting steps completed by patient: Adjust clothing prior to toileting     FIM - Bed/Chair Transfer Bed/Chair Transfer: 1: Two helpers;3: Sit > Supine: Mod A (lifting assist/Pt. 50-74%/lift 2 legs);1: Supine > Sit: Total A (helper does all/Pt. < 25%) (max A with RW)  FIM - Locomotion: Wheelchair Locomotion: Wheelchair: 5: Travels 150 ft or more: maneuvers on rugs and over door sills with supervision, cueing or coaxing FIM - Locomotion: Ambulation Locomotion: Ambulation Assistive Devices: Walker - Rolling (unsafe to attempt without device) Ambulation/Gait Assistance: 4: Min assist Locomotion: Ambulation: 1: Travels less than 50 ft with minimal assistance (Pt.>75%) (20 ft)  Comprehension Comprehension Mode: Auditory Comprehension: 7-Follows complex conversation/direction: With no assist  Expression Expression Mode: Verbal Expression: 7-Expresses complex ideas: With no assist  Social Interaction Social Interaction: 6-Interacts appropriately with others with medication or extra time (anti-anxiety, antidepressant).  Problem Solving Problem Solving: 7-Solves complex problems: Recognizes & self-corrects  Memory Memory: 7-Complete Independence: No helper  1. DVT Prophylaxis/Anticoagulation: Pharmaceutical: Lovenox  2. Pain Management: will schedule meds prior to therapies as currently in constant pain. Advise patient to take meds to keep pain under control.  3. Mood: Monitor for now. No signs of depression.   4.  FUO: ? Post op v/s UTI. Encourage IS every hour. Continue antibiotics. UCX pending. Has increased leukocytosis.  Check dopplers as has not been on anticoagulation and with decreased mobility.  No indication for CXR at this time. 5. DM type 2: monitor BS with ac/hs cbg checks and use SSI to keep BS under control and promote healing. 6.HTN: Monitor with bid checks. Will adjust for better control if BP remains elevated. Resume amaryl and monitor for need for meal coverage.  7. ABLA: Will monitor for now. Add iron supplement.  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 Will monitor trend to see if this improves with antibiotic on board. See above  9. Dyslipidemia: continue Zocor  LOS (Days) 1 A FACE TO FACE EVALUATION WAS PERFORMED  Algernon Mundie T 09/26/2011, 10:15 AM

## 2011-09-26 NOTE — Progress Notes (Signed)
VASCULAR LAB PRELIMINARY  PRELIMINARY  PRELIMINARY  PRELIMINARY  Bilateral lower extremity venous duplex  completed.    Preliminary report:  Bilateral:  No obvious evidence of DVT.  Mid thigh to mid calf not imaged due to bandages.   Terance Hart,  RVT 09/26/2011, 5:36 PM

## 2011-09-26 NOTE — Progress Notes (Signed)
Physical Therapy Session Note  Patient Details  Name: [name] [name] MRN: [id number] Date of Birth: 03-04-1956  Today's Date: 09/26/2011 Time: 1400-1443 Time Calculation (min): 43 min  Short Term Goals: No short term goals set  Skilled Therapeutic Interventions/Progress Updates:  There-ex to increase a/arom ble's to aid mobility, especially bed mobility; therapeutic activity- transfer training from 24 in. bed (same as home) with mod A with pt pulling on RW, standing balance and tolerance playing game 5 min with S; gait training 32, 30 ft min A d/t one episode pt jerking d/t shooting pain he attributed to taking too big of a step; discussed car transfer home and that pt may likely scoot into back seat d/t legs braces locked.  Therapy Documentation Precautions:  Precautions Precautions: Fall Precaution Comments: knees locked in full extension Required Braces or Orthoses: Yes Knee Immobilizer: On at all times Restrictions Weight Bearing Restrictions: No RLE Weight Bearing: Weight bearing as tolerated LLE Weight Bearing: Weight bearing as tolerated Other Position/Activity Restrictions: bledsoe braces locked in full extension    Vital Signs: Therapy Vitals Pulse Rate: 100  (resting) Pain: Pain Assessment Pain Assessment: 0-10 Pain Score:   4 Pain Type: Surgical pain Pain Location: Knee Pain Orientation: Right;Left Pain Onset: On-going Pain Intervention(s): RN made aware;Rest;Repositioned (premedicated) Multiple Pain Sites: No Exercises: Total Joint Exercises Ankle Circles/Pumps: 20 reps Gluteal Sets: 10 reps;Strengthening x 2 Hip ABduction/ADduction: 10 x 2 reps;AAROM;Strengthening Straight Leg Raises: AAROM;Strengthening;10 x 2 reps;Supine Other Exercises Other Exercises: straight leggeed hip IR and ER 10 x 2     See FIM for current functional status  Therapy/Group: Individual Therapy  Michaelene Song 09/26/2011, 3:58 PM

## 2011-09-27 DIAGNOSIS — Z5189 Encounter for other specified aftercare: Secondary | ICD-10-CM

## 2011-09-27 DIAGNOSIS — E669 Obesity, unspecified: Secondary | ICD-10-CM

## 2011-09-27 DIAGNOSIS — M66259 Spontaneous rupture of extensor tendons, unspecified thigh: Secondary | ICD-10-CM

## 2011-09-27 LAB — CBC
HCT: 35.7 % — ABNORMAL LOW (ref 39.0–52.0)
HCT: 35.5 % — ABNORMAL LOW (ref 39.0–52.0)
Hemoglobin: 12.2 g/dL — ABNORMAL LOW (ref 13.0–17.0)
Hemoglobin: 12 g/dL — ABNORMAL LOW (ref 13.0–17.0)
MCH: 31 pg (ref 26.0–34.0)
MCHC: 33.8 g/dL (ref 30.0–36.0)
MCV: 90.8 fL (ref 78.0–100.0)
Platelets: 456 10*3/uL — ABNORMAL HIGH (ref 150–400)
RBC: 3.93 MIL/uL — ABNORMAL LOW (ref 4.22–5.81)
RBC: 3.95 MIL/uL — ABNORMAL LOW (ref 4.22–5.81)
WBC: 16.3 10*3/uL — ABNORMAL HIGH (ref 4.0–10.5)

## 2011-09-27 LAB — GLUCOSE, CAPILLARY
Glucose-Capillary: 225 mg/dL — ABNORMAL HIGH (ref 70–99)
Glucose-Capillary: 210 mg/dL — ABNORMAL HIGH (ref 70–99)

## 2011-09-27 MED ORDER — GLIMEPIRIDE 4 MG PO TABS
6.0000 mg | ORAL_TABLET | Freq: Every day | ORAL | Status: DC
  Administered 2011-09-28 – 2011-10-02 (×5): 6 mg via ORAL
  Filled 2011-09-27 (×7): qty 1

## 2011-09-27 MED ORDER — TRAZODONE HCL 100 MG PO TABS
100.0000 mg | ORAL_TABLET | Freq: Every day | ORAL | Status: DC
  Administered 2011-09-27 – 2011-10-01 (×5): 100 mg via ORAL
  Filled 2011-09-27 (×3): qty 2
  Filled 2011-09-27 (×5): qty 1

## 2011-09-27 NOTE — Progress Notes (Signed)
Occupational Therapy Session Note  Patient Details  Name: [name] [name] MRN: [id number] Date of Birth: December 12, 1955  Today's Date: 09/27/2011 Time: 3086-5784 Time Calculation (min): 42 min  Short Term Goals: Week 1:  OT Short Term Goal 1 (Week 1): Short term goals equal to long terms goals set at supervision to modified independent.  Skilled Therapeutic Interventions/Progress Updates:    Worked on sit to stand transitions from wheelchair.  Turned wheelchair arm rests around to allow for increased armchair height closer to the front of the chair.  Able to perform sit to stand for 3 intervals with min assist and progressed to min guard assist for the final 2 intervals.  Therapy Documentation Precautions:  Precautions Precautions: Fall Precaution Comments: knees locked in extension Required Braces or Orthoses: Yes Knee Immobilizer: On at all times Restrictions Weight Bearing Restrictions: No RLE Weight Bearing: Weight bearing as tolerated LLE Weight Bearing: Weight bearing as tolerated Other Position/Activity Restrictions: bledsoe braces locked in full extension   Pain: Pain Assessment Pain Assessment: 0-10 Pain Score:   5 Pain Type: Surgical pain Pain Location: Leg Pain Orientation: Right;Left Pain Descriptors: Aching Pain Frequency: Occasional Pain Intervention(s): Medication (See eMAR) ADL: See FIM for current functional status  Therapy/Group: Individual Therapy  [name] [name] OTR/L 09/27/2011, 3:53 PM Pager number [phone]

## 2011-09-27 NOTE — Progress Notes (Signed)
Inpatient Diabetes Program Recommendations  AACE/ADA: New Consensus Statement on Inpatient Glycemic Control (2009)  Target Ranges:  Prepandial:   less than 140 mg/dL      Peak postprandial:   less than 180 mg/dL (1-2 hours)      Critically ill patients:  140 - 180 mg/dL   Reason for Visit: Results for [name] [name], [name] [name] (MRN [id number]) as of 09/27/2011 13:10  Ref. Range 09/26/2011 21:09 09/27/2011 06:20 09/27/2011 07:16 09/27/2011 09:12 09/27/2011 11:38  Glucose-Capillary Latest Range: 70-99 mg/dL 409 (H)  811 (H)  914 (H)     CBG's remain elevated.  Meal coverage restarted 09/26/11.  Consider adding Lantus15 units daily to control fasting CBG's.  Will follow.

## 2011-09-27 NOTE — Progress Notes (Signed)
Patient ID: [name] [name], male   DOB: June 12, 1956, 56 y.o.   MRN: [id number] Patient ID: [name] [name], male   DOB: June 24, 1956, 56 y.o.   MRN: [id number] Subjective/Complaints: Doing well except for insomnia. No other complaints. Full ROS performed  Objective: Vital Signs: Blood pressure 128/71, pulse 97, temperature 98.3 F (36.8 C), temperature source Oral, resp. rate 19, height 5\' 8"  (1.727 m), weight 126.554 kg (279 lb), SpO2 93.00%. No results found.  Basename 09/27/11 0620 09/26/11 0604  WBC 16.3* 17.6*  HGB 12.2* 12.9*  HCT 35.7* 38.5*  PLT 456* 417*    Basename 09/26/11 0604  NA 132*  K 3.7  CL 94*  CO2 26  GLUCOSE 202*  BUN 19  CREATININE 0.83  CALCIUM 9.2   CBG (last 3)   Basename 09/27/11 0716 09/26/11 2109 09/26/11 1618  GLUCAP 225* 219* 136*    Wt Readings from Last 3 Encounters:  09/25/11 126.554 kg (279 lb)  09/23/11 117.6 kg (259 lb 4.2 oz)  09/23/11 117.6 kg (259 lb 4.2 oz)    Physical Exam:  General appearance: alert, cooperative and no distress Head: Normocephalic, without obvious abnormality, atraumatic Eyes: conjunctivae/corneas clear. PERRL, EOM's intact. Fundi benign. Ears: normal TM's and external ear canals both ears Nose: Nares normal. Septum midline. Mucosa normal. No drainage or sinus tenderness. Throat: lips, mucosa, and tongue normal; teeth and gums normal Neck: no adenopathy, no carotid bruit, no JVD, supple, symmetrical, trachea midline and thyroid not enlarged, symmetric, no tenderness/mass/nodules Back: symmetric, no curvature. ROM normal. No CVA tenderness. Resp: clear to auscultation bilaterally Cardio: regular rate and rhythm, S1, S2 normal, no murmur, click, rub or gallop GI: soft, non-tender; bowel sounds normal; no masses,  no organomegaly Extremities: extremities normal, atraumatic, no cyanosis or edema Pulses: 2+ and symmetric Skin: Skin color, texture, turgor normal. No rashes or lesions Neurologic: Grossly normal moves all  4's. No gross sensory loss. cogntively intact Incision/Wound: wounds dressed. In KI's 4/5  Assessment/Plan: 1. Functional deficits secondary to bilateral quad tendon ruptures/morbid obesity which require 3+ hours per day of interdisciplinary therapy in a comprehensive inpatient rehab setting. Physiatrist is providing close team supervision and 24 hour management of active medical problems listed below. Physiatrist and rehab team continue to assess barriers to discharge/monitor patient progress toward functional and medical goals. FIM: FIM - Bathing Bathing Steps Patient Completed: Chest;Right Arm;Left Arm;Abdomen;Front perineal area;Buttocks;Right upper leg;Left upper leg Bathing: 4: Min-Patient completes 8-9 81f 10 parts or 75+ percent  FIM - Upper Body Dressing/Undressing Upper body dressing/undressing steps patient completed: Thread/unthread right sleeve of pullover shirt/dresss;Thread/unthread left sleeve of pullover shirt/dress;Put head through opening of pull over shirt/dress;Pull shirt over trunk Upper body dressing/undressing: 5: Set-up assist to: Obtain clothing/put away FIM - Lower Body Dressing/Undressing Lower body dressing/undressing: 1: Total-Patient completed less than 25% of tasks  FIM - Toileting Toileting steps completed by patient: Adjust clothing prior to toileting     FIM - Banker Devices: Manufacturing systems engineer Transfer: 2: Chair or W/C > Bed: Max A (lift and lower assist);2: Sit > Supine: Max A (lifting assist/Pt. 25-49%)  FIM - Locomotion: Wheelchair Locomotion: Wheelchair: 5: Travels 150 ft or more: maneuvers on rugs and over door sills with supervision, cueing or coaxing FIM - Locomotion: Ambulation Locomotion: Ambulation Assistive Devices: Walker - Rolling (unsafe to attempt without device) Ambulation/Gait Assistance: 4: Min assist Locomotion: Ambulation: 1: Travels less than 50 ft with minimal assistance (Pt.>75%) (20  ft)  Comprehension Comprehension Mode: Auditory Comprehension: 7-Follows  complex conversation/direction: With no assist  Expression Expression Mode: Verbal Expression: 7-Expresses complex ideas: With no assist  Social Interaction Social Interaction: 6-Interacts appropriately with others with medication or extra time (anti-anxiety, antidepressant).  Problem Solving Problem Solving: 7-Solves complex problems: Recognizes & self-corrects  Memory Memory: 7-Complete Independence: No helper  1. DVT Prophylaxis/Anticoagulation: Pharmaceutical: Lovenox , dopplers limited but negative 2. Pain Management: will schedule meds prior to therapies as currently in constant pain. Advise patient to take meds to keep pain under control.  3. Mood: Monitor for now. No signs of depression.  4. FUO: ? Post op v/s UTI. Encourage IS every hour. Continue antibiotics. UCX pending (i question whether it was done--will re order)  .-Had increased leukocytosis yesterday.  -recheck today  -dopplers negative  5. DM type 2: monitor BS with ac/hs cbg checks and use SSI to keep BS under control and promote healing. Resumed amaryl. Will further titrate today . 6.HTN: Monitor with bid checks. Will adjust for better control if BP remains elevated.  7. ABLA: Will monitor for now. Add iron supplement.   9. Dyslipidemia: continue Zocor  LOS (Days) 2 A FACE TO FACE EVALUATION WAS PERFORMED  Tatyanna Cronk T 09/27/2011, 9:07 AM

## 2011-09-27 NOTE — Progress Notes (Signed)
Physical Therapy Session Note  Patient Details  Name: [name] [name] MRN: [id number] Date of Birth: February 02, 1956  Today's Date: 09/27/2011 Time: 1115-1210 Time Calculation (min): 55 min  Skilled Therapeutic Interventions/Progress Updates: w/c propulsion on unit to/from therapy for general UE strengthening and endurance. Sit to stands from w/c mod A due to legs in braces/positioning. From elevated surface able to complete with min A (raised mat). Gait with RW for functional strengthening with steady A x 65' with RW; x 25'. Supine therex for functional lower extremity strengthening to aid with mobility: hip abduction, IR/ER, ankle pumps and AAROM straight leg raises 15 reps each x 3 sets. Mod A with bed mobility supine <-> sit. Min A stand step from w/c to recliner with RW; lower extremities elevated for comfort and positioning.      Therapy Documentation Precautions:  Precautions Precautions: Fall Precaution Comments: knees locked in full extension Required Braces or Orthoses: Yes Knee Immobilizer: On at all times Restrictions Weight Bearing Restrictions: No RLE Weight Bearing: Weight bearing as tolerated LLE Weight Bearing: Weight bearing as tolerated Other Position/Activity Restrictions: bledsoe braces locked in full extension Pain: Premedicated. Few complaints with mobility/transitional movements  Locomotion : Ambulation Ambulation/Gait Assistance: 4: Min assist   See FIM for current functional status  Therapy/Group: Individual Therapy  Jeri, [facility] 09/27/2011, 12:14 PM

## 2011-09-27 NOTE — Progress Notes (Signed)
Occupational Therapy Session Note  Patient Details  Name: [name] [name] MRN: [id number] Date of Birth: [birth year]  Today's Date: 09/27/2011 Time: 0930-1030 Time Calculation (min): 60 min  Short Term Goals: Week 1:  OT Short Term Goal 1 (Week 1): Short term goals equal to long terms goals set at supervision to modified independent.  Skilled Therapeutic Interventions/Progress Updates:    Pt seen for B/D sit to stand from EOB.  Integrated reacher and began education on sockaide for LB dressing.  Pt continues to need assistance with sit to stand secondary to not being able to flex his knees in his current lockout braces.  Once standing he is able to transfer with min guard assist.  Braces make it difficult to integrate the sockaide as well as him having weakness in both LEs.   Pt motivated to be as independent as possible.  Therapy Documentation Precautions:  Precautions Precautions: Fall Precaution Comments: knees locked in full extension Required Braces or Orthoses: Yes Knee Immobilizer: On at all times Restrictions Weight Bearing Restrictions: No RLE Weight Bearing: Weight bearing as tolerated LLE Weight Bearing: Weight bearing as tolerated Other Position/Activity Restrictions: bledsoe braces locked in full extension  Pain: Pain Assessment Pain Assessment: 0-10 Pain Score:   3 Pain Type: Surgical pain Pain Location: Leg Pain Orientation: Right;Left Pain Intervention(s): Medication (See eMAR) Multiple Pain Sites: No ADL:  See FIM for current functional status  Therapy/Group: Individual Therapy  Rome Echavarria OTR/L 09/27/2011, 11:56 AM

## 2011-09-27 NOTE — Progress Notes (Signed)
Social Work  Social Work Assessment and Plan  Patient Details  Name: [name] [name] MRN: [id number] Date of Birth: 30-May-1956  Today's Date: 09/27/2011  Problem List:  Patient Active Problem List  Diagnoses  . Quadriceps tendon rupture  . HTN (hypertension)  . DM (diabetes mellitus)  . Hyperlipidemia  . Obesity, Class III, BMI 40-49.9 (morbid obesity)   Past Medical History:  Past Medical History  Diagnosis Date  . Hypertension   . Diabetes mellitus    Past Surgical History:  Past Surgical History  Procedure Date  . Quadriceps tendon repair 09/23/2011    Procedure: REPAIR QUADRICEP TENDON;  Surgeon: [name] Mustard, MD;  Location: [name];  Service: Orthopedics;  Laterality: Bilateral;  Bilateral Quadriceps Tendon Repair   Social History:  reports that he has never smoked. He does not have any smokeless tobacco history on file. He reports that he does not drink alcohol or use illicit drugs.  Family / Support Systems Marital Status: Single Spouse/Significant Other: NA Children: NA Other Supports: [name] [name] @ [phone] and his brother-in-law.  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  Denies any significant emotional distress.  No s/s of depression or anxiety and no history of psych issues, therefore, depression screen not indicated at this time. Recent Psychosocial Issues: None Pyschiatric History: None Substance Abuse History: None  Patient / Family Perceptions, Expectations & Goals Pt/Family understanding of illness & functional limitations: Pt and sister with basic understanding of injuries suffered at work, surgery performed and need for rehab.  No questions/ concerns at this time. Premorbid pt/family roles/activities: Single gentleman living alone and working full-time as Agricultural consultant.  Close with family. Anticipated changes in roles/activities/participation: Initially sister and her husband to provide caregiver support until pt independent enough to return to his own home. Pt/family expectations/goals: Per pt,"I just want to get as good as I can here."  [employer]: None Premorbid Home Care/DME Agencies: None Transportation available at discharge: yes  Discharge Planning Living Arrangements: Alone (to stay with sister temp at d/c) Support Systems: Other relatives Type of Residence: Private residence Insurance Resources: Media planner (specify) (Worker's Comp) Surveyor, quantity Resources: Other (Comment) (Worker's Comp) Financial Screen Referred: No Living Expenses: Own Money Management: Patient Do you have any problems obtaining your medications?: No Home Management: pt Patient/Family Preliminary Plans: Pt plans to stay with sister and brother-in-law until he is independent with mobility and self-care Social Work Anticipated Follow Up Needs: HH/OP Expected length of stay: 7-10 days  Clinical Impression Pleasant gentleman here after injury at work.  Anticipate short LOS.  No significant emotional distress/ issues  noted.  Excellent family support with plans to stay with sister at d/c where 24/7 assist is available.    [name] [name] 09/27/2011, 3:32 PM

## 2011-09-27 NOTE — Progress Notes (Signed)
Physical Therapy Session Note  Patient Details  Name: [name] [name] MRN: [id number] Date of Birth: [birth year]  Today's Date: 09/27/2011 Time: 1430-1520 Time Calculation (min): 50 min  Short Term Goals: No short term goals set  Skilled Therapeutic Interventions/Progress Updates:    Transfer training- pt able to sit/stand from 21-22 inch height seat w/c without physical A with desk length armrest turned around to push from and hospital grip socks on; discussed pt wearing shoes to see how it alters his pattern d/t not being able to slide legs out from under him to sit. To have sister bring in shoes.  Car transfer training. Household gait training and endurance gait training, pt HR still ellavated to 140 with activity and decreased functional efficiancy with all movement..  Therapy Documentation Precautions:  Precautions Precautions: Fall Precaution Comments: knees locked in extension Required Braces or Orthoses: Yes Knee Immobilizer: On at all times Restrictions Weight Bearing Restrictions: No RLE Weight Bearing: Weight bearing as tolerated LLE Weight Bearing: Weight bearing as tolerated Other Position/Activity Restrictions: bledsoe braces locked in full extension   Vital Signs: Therapy Vitals Pulse Rate: 140  Pain: Pain Assessment Pain Assessment:  (4/10 B knees, premedicated) Mobility: Transfers Sit to Stand Details (indicate cue type and reason): sit to stand between each frame of wii bowling with S and increased time and effort, HR 140 Locomotion : Ambulation Ambulation/Gait Assistance: 5: Supervision Ambulation Distance (Feet): 144 Feet Assistive device: Rolling walker Ambulation/Gait Assistance Details (indicate cue type and reason): step thru pattern, 144 ft controlled but also practiced side stepping and backing up, cues to look up with gait, able to scan environemnt without increased LOB      Balance:able to balance with 1 UE to wii bowl mod I    Other Treatments:  Treatments Therapeutic Activity: car transfer- pt slid across seat as if it were a bench back seat keeping legs extended in braces with min A only to unweight the legs to scoot back  See FIM for current functional status  Therapy/Group: Individual Therapy  Michaelene Song 09/27/2011, 3:35 PM

## 2011-09-28 LAB — GLUCOSE, CAPILLARY
Glucose-Capillary: 196 mg/dL — ABNORMAL HIGH (ref 70–99)
Glucose-Capillary: 241 mg/dL — ABNORMAL HIGH (ref 70–99)
Glucose-Capillary: 170 mg/dL — ABNORMAL HIGH (ref 70–99)

## 2011-09-28 MED ORDER — HYDROCHLOROTHIAZIDE 25 MG PO TABS
25.0000 mg | ORAL_TABLET | Freq: Every day | ORAL | Status: DC
  Administered 2011-09-29 – 2011-10-02 (×4): 25 mg via ORAL
  Filled 2011-09-28 (×5): qty 1

## 2011-09-28 MED ORDER — LISINOPRIL 10 MG PO TABS
10.0000 mg | ORAL_TABLET | Freq: Every day | ORAL | Status: DC
  Administered 2011-09-29 – 2011-10-02 (×4): 10 mg via ORAL
  Filled 2011-09-28 (×5): qty 1

## 2011-09-28 NOTE — Progress Notes (Signed)
Patient ID: [name] [name], male   DOB: [birth year], 56 y.o.   MRN: [id number] Patient ID: [name] [name], male   DOB: September 29, 1955, 56 y.o.   MRN: [id number] Patient ID: [name] [name], male   DOB: 07-24-1955, 56 y.o.   MRN: [id number] Subjective/Complaints: Doing well except for insomnia. No other complaints. Full ROS performed. Low BP per RN  Objective: Vital Signs: Blood pressure 100/50, pulse 85, temperature 98.5 F (36.9 C), temperature source Oral, resp. rate 18, height 5\' 8"  (1.727 m), weight 279 lb (126.554 kg), SpO2 93.00%. No results found.  Basename 09/27/11 0912 09/27/11 0620  WBC 15.6* 16.3*  HGB 12.0* 12.2*  HCT 35.5* 35.7*  PLT 465* 456*    Basename 09/26/11 0604  NA 132*  K 3.7  CL 94*  CO2 26  GLUCOSE 202*  BUN 19  CREATININE 0.83  CALCIUM 9.2   CBG (last 3)   Basename 09/28/11 0741 09/27/11 2117 09/27/11 1602  GLUCAP 196* 189* 210*    Wt Readings from Last 3 Encounters:  09/25/11 279 lb (126.554 kg)  09/23/11 259 lb 4.2 oz (117.6 kg)  09/23/11 259 lb 4.2 oz (117.6 kg)    Physical Exam:  General appearance: alert, cooperative and no distress Head: Normocephalic, without obvious abnormality, atraumatic Eyes: conjunctivae/corneas clear. PERRL, EOM's intact. Fundi benign. Ears: normal TM's and external ear canals both ears Nose: Nares normal. Septum midline. Mucosa normal. No drainage or sinus tenderness. Throat: lips, mucosa, and tongue normal; teeth and gums normal Neck: no adenopathy, no carotid bruit, no JVD, supple, symmetrical, trachea midline and thyroid not enlarged, symmetric, no tenderness/mass/nodules Back: symmetric, no curvature. ROM normal. No CVA tenderness. Resp: clear to auscultation bilaterally Cardio: regular rate and rhythm, S1, S2 normal, no murmur, click, rub or gallop GI: soft, non-tender; bowel sounds normal; no masses,  no organomegaly Extremities: extremities normal, atraumatic, no cyanosis or edema Pulses: 2+ and symmetric Skin: Skin  color, texture, turgor normal. No rashes or lesions Neurologic: Grossly normal moves all 4's. No gross sensory loss. cogntively intact Incision/Wound: wounds dressed. In KI's 4/5  Assessment/Plan: 1. Functional deficits secondary to bilateral quad tendon ruptures/morbid obesity which require 3+ hours per day of interdisciplinary therapy in a comprehensive inpatient rehab setting. Physiatrist is providing close team supervision and 24 hour management of active medical problems listed below. Physiatrist and rehab team continue to assess barriers to discharge/monitor patient progress toward functional and medical goals. FIM: FIM - Bathing Bathing Steps Patient Completed: Chest;Right Arm;Left Arm;Front perineal area;Buttocks;Right upper leg;Left upper leg Bathing: 4: Min-Patient completes 8-9 46f 10 parts or 75+ percent  FIM - Upper Body Dressing/Undressing Upper body dressing/undressing steps patient completed: Put head through opening of pull over shirt/dress;Thread/unthread left sleeve of pullover shirt/dress;Thread/unthread right sleeve of pullover shirt/dresss;Pull shirt over trunk Upper body dressing/undressing: 5: Set-up assist to: Obtain clothing/put away FIM - Lower Body Dressing/Undressing Lower body dressing/undressing steps patient completed: Pull pants up/down;Don/Doff right sock;Don/Doff left sock (Able to doff socks with reacher and pull pants over hips.) Lower body dressing/undressing: 3: Mod-Patient completed 50-74% of tasks (Pt kept shorts on from previous day so did not donn over LEs)  FIM - Toileting Toileting steps completed by patient: Adjust clothing prior to toileting     FIM - Banker Devices: Manufacturing systems engineer Transfer: 3: Sit > Supine: Mod A (lifting assist/Pt. 50-74%/lift 2 legs);3: Supine > Sit: Mod A (lifting assist/Pt. 50-74%/lift 2 legs;3: Chair or W/C > Bed: Mod A (lift or lower assist);3: Bed >  Chair or W/C: Mod A (lift  or lower assist)  FIM - Locomotion: Wheelchair Locomotion: Wheelchair: 5: Travels 150 ft or more: maneuvers on rugs and over door sills with supervision, cueing or coaxing FIM - Locomotion: Ambulation Locomotion: Ambulation Assistive Devices: Walker - Rolling;Orthosis Ambulation/Gait Assistance: 5: Supervision Locomotion: Ambulation: 2: Travels 50 - 149 ft with minimal assistance (Pt.>75%)  Comprehension Comprehension Mode: Auditory Comprehension: 7-Follows complex conversation/direction: With no assist  Expression Expression Mode: Verbal Expression: 7-Expresses complex ideas: With no assist  Social Interaction Social Interaction: 6-Interacts appropriately with others with medication or extra time (anti-anxiety, antidepressant).  Problem Solving Problem Solving: 7-Solves complex problems: Recognizes & self-corrects  Memory Memory: 7-Complete Independence: No helper  1. DVT Prophylaxis/Anticoagulation: Pharmaceutical: Lovenox , dopplers limited but negative 2. Pain Management: will schedule meds prior to therapies as currently in constant pain. Advise patient to take meds to keep pain under control.  3. Mood: Monitor for now. No signs of depression.  4. FUO: ? Post op v/s UTI. Encourage IS every hour. Continue antibiotics. UCX pending (i question whether it was done--will re order)  .-Had increased leukocytosis yesterday.  -recheck today  -dopplers negative  5. DM type 2: monitor BS with ac/hs cbg checks and use SSI to keep BS under control and promote healing. Resumed amaryl. Will further titrate today . 6.HTN: Monitor with bid checks. Will adjust for better control if BP remains elevated. Will decrease Lisinopril 7. ABLA: Will monitor for now. Add iron supplement.   9. Dyslipidemia: continue Zocor  LOS (Days) 3 A FACE TO FACE EVALUATION WAS PERFORMED  Alex Zerrick Hanssen 09/28/2011, 9:51 AM

## 2011-09-28 NOTE — Progress Notes (Addendum)
Physical Therapy Session Note  Patient Details  Name: [name] [name] MRN: [id number] Date of Birth: Nov 21, 1955  Today's Date: 09/28/2011 Time:  -  8:15-9:06 ( )    Short Term Goals: No short term goals set  Skilled Therapeutic Interventions/Progress Updates: Tx focused on transfer training, bed mobility, gait with RW, and there-ex for LE strengthening. Educated pt on long-sit for supine>sit transition to decrease pull on quads once LEs off bed. Pt needed Mod A for trunk lifting initialy, but able to bring LEs down without assist, and progressed to Min A with practice and cues for elbow/hand placement. S for sit<>stand from bed and mat. Gait from room >gym with close S and cues for decreasing UE weight bearing as able as well as forward gaze. Pt needed 1 standing rest x30 sec. Sit>supine with Mod A for bil LEs, pt >50%. Supine ther-ex: ankle pumps, hip ABD/ADD 2x20 on sliding board, SLR 2x10 with assist. Adjusted pt's braces and educated on proper positioning with dial at knee joint.      Therapy Documentation Precautions:  Precautions Precautions: Fall Precaution Comments: knees locked in extension Required Braces or Orthoses: Yes Knee Immobilizer: On at all times Restrictions Weight Bearing Restrictions: No RLE Weight Bearing: Weight bearing as tolerated LLE Weight Bearing: Weight bearing as tolerated Other Position/Activity Restrictions: bledsoe braces locked in full extension    Vital Signs: Therapy Vitals Temp: 98.5 F (36.9 C) Temp src: Oral Pulse Rate: 85  Resp: 18  BP: 100/50 mmHg Patient Position, if appropriate: Sitting Oxygen Therapy SpO2: 93 % Pain: Pain Assessment Pain Score:   5 Pain Intervention(s): RN made aware  See FIM for current functional status  Therapy/Group: Individual Therapy  [name], PT 09/28/2011, 8:49 AM

## 2011-09-28 NOTE — Progress Notes (Signed)
Physical Therapy Session Note  Patient Details  Name: [name] [name] MRN: [id number] Date of Birth: 1956-05-28  Today's Date: 09/28/2011 Time: 1345-1430 Time Calculation (min): 45 min  Short Term Goals: No STGs:     Skilled Therapeutic Interventions/Progress Updates:  Tx focused on increasing safety and independence with functional mobility and gait. Repeated sit<>stand x15 during Wii bowling each frame WC>RW. Gait 1x200' with RW and close S with cues for upright posture and decreasing UE weight bearing. Pt with good problem solving strategies for hand palcement, LE placement, and timing with sit<>stand.      Therapy Documentation Precautions:  Precautions Precautions: Fall Precaution Comments: knees locked in extension Required Braces or Orthoses: Yes Knee Immobilizer: On at all times Restrictions Weight Bearing Restrictions: No RLE Weight Bearing: Weight bearing as tolerated LLE Weight Bearing: Weight bearing as tolerated Other Position/Activity Restrictions: bledsoe braces locked in full extension    Pain: Pain Assessment Pain Assessment: 0-10 Pain Score:   7 Pain Type: Surgical pain Pain Location: Leg Pain Orientation: Right;Left Pain Descriptors: Aching Pain Frequency: Occasional Pain Onset: Gradual Patients Stated Pain Goal: 2 Pain Intervention(s): Medication (See eMAR)    Locomotion : Ambulation Ambulation/Gait Assistance: 5: Supervision   See FIM for current functional status  Therapy/Group: Individual Therapy  [name] 09/28/2011, 2:34 PM

## 2011-09-28 NOTE — Progress Notes (Signed)
Occupational Therapy Session Note  Patient Details  Name: [name] [name] MRN: [id number] Date of Birth: [birth year]  Today's Date: 09/28/2011 Visit 1: Time: 1030-1130 Time Calculation (min): 60 min Visit 2: 1445 -1530 (45 minutes)  Short Term Goals: Week 1:  OT Short Term Goal 1 (Week 1): Short term goals equal to long terms goals set at supervision to modified independent.  Skilled Therapeutic Interventions/Progress Updates:  Visit 1:  Pt scheduled for bathing and dressing,  But pt declined to work on those tasks this am.  Pt seen for ADL retraining of ADL transfers bed to toilet to recliner to wheelchair all with RW all with supervision and set up to adjust wheelchair leg rests.  Pt worked on getting on and off mat with leg lifter to facilitate improved bed mobility with supervision. Visit 2:  Pt seen for continued ADL retraining of doffing socks with reacher, donning socks with sock aid, and donning shoes with shoe horn.  Pt able to manage socks independently with AE, but shoes were too difficult.  Pt may be able to try donning shoes from standing position.  Pt worked on UE strengthening with arm cycle and 4 pound bar and sit to stand, ambulation with walker to demonstrate to his mother and sister his progress.       Therapy Documentation Precautions:  Precautions Precautions: Fall Precaution Comments: knees locked in extension Required Braces or Orthoses: Yes Knee Immobilizer: On at all times Restrictions Weight Bearing Restrictions: No RLE Weight Bearing: Weight bearing as tolerated LLE Weight Bearing: Weight bearing as tolerated Other Position/Activity Restrictions: bledsoe braces locked in full extension      Pain: Pain Assessment Pain Assessment: 0-10 Pain Score:   7 Pain Type: Surgical pain Pain Location: Leg Pain Orientation: Right;Left Pain Descriptors: Aching Pain Frequency: Occasional Pain Onset: Gradual Patients Stated Pain Goal: 2 Pain Intervention(s):  Medication (See eMAR)     See FIM for current functional status  Therapy/Group: Individual Therapy  Isobella Ascher 09/28/2011, 1:09 PM

## 2011-09-29 LAB — GLUCOSE, CAPILLARY
Glucose-Capillary: 214 mg/dL — ABNORMAL HIGH (ref 70–99)
Glucose-Capillary: 146 mg/dL — ABNORMAL HIGH (ref 70–99)
Glucose-Capillary: 170 mg/dL — ABNORMAL HIGH (ref 70–99)

## 2011-09-29 LAB — URINE CULTURE
Colony Count: NO GROWTH
Culture: NO GROWTH

## 2011-09-29 NOTE — Progress Notes (Signed)
Patient ID: Ronie Barnhart, male   DOB: 1956/04/17, 56 y.o.   MRN: [id number] Patient ID: Chao Blazejewski, male   DOB: Jan 09, 1956, 56 y.o.   MRN: [id number] Patient ID: Hilton Saephan, male   DOB: [birth year], 56 y.o.   MRN: [id number] Patient ID: Mando Blatz, male   DOB: 03/19/1956, 56 y.o.   MRN: [id number] Subjective/Complaints: Doing well except for insomnia. No other complaints. Full ROS performed. Low BP per RN  Objective: Vital Signs: Blood pressure 138/79, pulse 89, temperature 97.6 F (36.4 C), temperature source Oral, resp. rate 18, height 5\' 8"  (1.727 m), weight 279 lb (126.554 kg), SpO2 94.00%. No results found.  Basename 09/27/11 0912 09/27/11 0620  WBC 15.6* 16.3*  HGB 12.0* 12.2*  HCT 35.5* 35.7*  PLT 465* 456*   No results found for this basename: NA:2,K:2,CL:2,CO2:2,GLUCOSE:2,BUN:2,CREATININE:2,CALCIUM:2 in the last 72 hours CBG (last 3)   Basename 09/29/11 0714 09/28/11 2002 09/28/11 1645  GLUCAP 183* 165* 170*    Wt Readings from Last 3 Encounters:  09/25/11 279 lb (126.554 kg)  09/23/11 259 lb 4.2 oz (117.6 kg)  09/23/11 259 lb 4.2 oz (117.6 kg)    Physical Exam:  General appearance: alert, cooperative and no distress Head: Normocephalic, without obvious abnormality, atraumatic Eyes: conjunctivae/corneas clear. PERRL, EOM's intact. Fundi benign. Ears: normal TM's and external ear canals both ears Nose: Nares normal. Septum midline. Mucosa normal. No drainage or sinus tenderness. Throat: lips, mucosa, and tongue normal; teeth and gums normal Neck: no adenopathy, no carotid bruit, no JVD, supple, symmetrical, trachea midline and thyroid not enlarged, symmetric, no tenderness/mass/nodules Back: symmetric, no curvature. ROM normal. No CVA tenderness. Resp: clear to auscultation bilaterally Cardio: regular rate and rhythm, S1, S2 normal, no murmur, click, rub or gallop GI: soft, non-tender; bowel sounds normal; no masses,  no organomegaly Extremities: extremities normal,  atraumatic, no cyanosis or edema Pulses: 2+ and symmetric Skin: Skin color, texture, turgor normal. No rashes or lesions Neurologic: Grossly normal moves all 4's. No gross sensory loss. cogntively intact Incision/Wound: wounds dressed. In KI's   Assessment/Plan: 1. Functional deficits secondary to bilateral quad tendon ruptures/morbid obesity which require 3+ hours per day of interdisciplinary therapy in a comprehensive inpatient rehab setting. Physiatrist is providing close team supervision and 24 hour management of active medical problems listed below. Physiatrist and rehab team continue to assess barriers to discharge/monitor patient progress toward functional and medical goals. FIM: FIM - Bathing Bathing Steps Patient Completed: Chest;Right Arm;Left Arm;Front perineal area;Buttocks;Right upper leg;Left upper leg Bathing: 4: Min-Patient completes 8-9 50f 10 parts or 75+ percent  FIM - Upper Body Dressing/Undressing Upper body dressing/undressing steps patient completed: Put head through opening of pull over shirt/dress;Thread/unthread left sleeve of pullover shirt/dress;Thread/unthread right sleeve of pullover shirt/dresss;Pull shirt over trunk Upper body dressing/undressing: 5: Set-up assist to: Obtain clothing/put away FIM - Lower Body Dressing/Undressing Lower body dressing/undressing steps patient completed: Pull pants up/down;Don/Doff right sock;Don/Doff left sock (Able to doff socks with reacher and pull pants over hips.) Lower body dressing/undressing: 3: Mod-Patient completed 50-74% of tasks (Pt kept shorts on from previous day so did not donn over LEs)  FIM - Toileting Toileting steps completed by patient: Adjust clothing prior to toileting     FIM - Banker Devices: Therapist, occupational: 5: Bed > Chair or W/C: Supervision (verbal cues/safety issues);5: Chair or W/C > Bed: Supervision (verbal cues/safety issues)  FIM -  Locomotion: Wheelchair Locomotion: Wheelchair: 5: Travels 150 ft or more: maneuvers on rugs and  over door sills with supervision, cueing or coaxing FIM - Locomotion: Ambulation Locomotion: Ambulation Assistive Devices: Walker - Rolling;Orthosis Ambulation/Gait Assistance: 5: Supervision Locomotion: Ambulation: 2: Travels 50 - 149 ft with minimal assistance (Pt.>75%)  Comprehension Comprehension Mode: Auditory Comprehension: 7-Follows complex conversation/direction: With no assist  Expression Expression Mode: Verbal Expression: 7-Expresses complex ideas: With no assist  Social Interaction Social Interaction: 6-Interacts appropriately with others with medication or extra time (anti-anxiety, antidepressant).  Problem Solving Problem Solving: 7-Solves complex problems: Recognizes & self-corrects  Memory Memory: 7-Complete Independence: No helper  1. DVT Prophylaxis/Anticoagulation: Pharmaceutical: Lovenox , dopplers limited but negative 2. Pain Management: will schedule meds prior to therapies as currently in constant pain. Advise patient to take meds to keep pain under control.  3. Mood: Monitor for now. No signs of depression.  4. FUO: ? Post op v/s UTI. Encourage IS every hour. Continue antibiotics. UCX pending (i question whether it was done--will re order)  .-Had increased leukocytosis yesterday.  -recheck today  -dopplers negative  5. DM type 2: monitor BS with ac/hs cbg checks and use SSI to keep BS under control and promote healing. Resumed amaryl. Will further titrate on Mon . 6.HTN: Monitor with bid checks. Will adjust for better control if BP remains elevated. Will decrease Lisinopril 7. ABLA: Will monitor for now. Add iron supplement.   9. Dyslipidemia: continue Zocor  LOS (Days) 4 A FACE TO FACE EVALUATION WAS PERFORMED  Alex Genine Beckett 09/29/2011, 10:18 AM

## 2011-09-29 NOTE — Progress Notes (Signed)
Occupational Therapy Note  Patient Details  Name: Berton Butrick MRN: [id number] Date of Birth: 03/20/1956 Today's Date: 09/29/2011  TAG - Activities Group 1300-1400 - 60 Minutes Patient with 10/10 pain throughout bilateral LEs at beginning of session, RN aware. At end of session, patient stated pain was "better". Group setting with one other patient engaging in therapeutic activity in therapy gym. Used Wii for activity while focusing on dynamic standing balance/tolerance/endurace, increasing overall activity tolerance/endurance, sit/stands, and for BUE ROM exercises. Also engaged in therapeutic exercises focusing on bed mobility using leg lifter, and functional ambulation throughout therapy gym, from gym -> room, and throughout room (using rolling walker). Patient demonstrates safe use of rolling walker and safe transfers using rolling walker. Patient requested to get back to bed at end of session, left patient supine in bed with call bell and phone within reach.   Dore Oquin 09/29/2011, 2:15 PM

## 2011-09-30 LAB — GLUCOSE, CAPILLARY
Glucose-Capillary: 162 mg/dL — ABNORMAL HIGH (ref 70–99)
Glucose-Capillary: 170 mg/dL — ABNORMAL HIGH (ref 70–99)

## 2011-09-30 NOTE — Progress Notes (Signed)
Physical Therapy Session Note  Patient Details  Name: [name] [name] MRN: [id number] Date of Birth: December 11, 1955  Today's Date: 09/30/2011 Time: 1302-1410 Time Calculation (min): 68 min  Short Term Goals: See d/c  Skilled Therapeutic Interventions/Progress Updates:    Community gait training- pt  Up to 200 ft x 3 even, uneven surface, uphill and downhill with RW S- , practiced transferring from lower benches with and without armrests, pt able with increased time and energy expenditure but mod I with RW; propelled w/c to ground floor exit from 4000 for activity tolerance and verbalized he had been stretching UE for joint preservation to shoulder; discussed and given written LE  HEP and safety in the home handout  Braces had to be readjusted d/t sliding down, discussed need for training with sister for braces; also discussed if pt wanted w/c for community d/t needing chairs with place to rest legs (ie elevating leg rests); he is determining if w/c available to him would be appropriate Therapy Documentation Precautions:  Precautions Precautions: Fall Precaution Comments: knees locked in extension Required Braces or Orthoses: Yes Knee Immobilizer: On at all times Restrictions Weight Bearing Restrictions: No RLE Weight Bearing: Weight bearing as tolerated LLE Weight Bearing: Weight bearing as tolerated Other Position/Activity Restrictions: bledsoe braces locked in full extension         See FIM for current functional status  Therapy/Group: Individual Therapy  Michaelene Song 09/30/2011, 2:14 PM

## 2011-09-30 NOTE — Plan of Care (Signed)
Problem: RH Stairs Goal: LTG Patient will ambulate up and down stairs w/assist (PT) LTG: Patient will ambulate up and down # of stairs with assistance (PT)  Outcome: Not Applicable Date Met:  09/30/11 Goal set today, this was in error or duplicate

## 2011-09-30 NOTE — Progress Notes (Signed)
Physical Therapy Session Note  Patient Details  Name: [name] [name] MRN: [id number] Date of Birth: [birth year]  Today's Date: 09/30/2011 Time: 0900-1010 Time Calculation (min): 70 min  Short Term Goals: See d/c goals  Skilled Therapeutic Interventions/Progress Updates:    Discussed progress with pt and improved energy efficiency with mobility, especially transfers and gait; d/c planned for Wed; he is to have sister measure RW to see if wide RW needs to be ordered; goals upgraded with pt in agreement.  Gait trial with crutches using 3 point pattern with close S but pt reports he is more confident with RW and chooses to d/c with that device; pt putting much less pressure thru arms today noting increased weight bearing on BLE's  Therapeutic activity: transfer taining and bed mobility- pt using leg lifter for car trnasfer and bed mobility to manage BLE's in braces now  PT held knees in full extension while PA removed dressings.  Pt total assist for braces as he can not long sit in the bed.  Therapy Documentation Precautions:  Precautions Precautions: Fall Precaution Comments: knees locked in extension Required Braces or Orthoses: Yes Knee Immobilizer: On at all times Restrictions Weight Bearing Restrictions: No RLE Weight Bearing: Weight bearing as tolerated LLE Weight Bearing: Weight bearing as tolerated Other Position/Activity Restrictions: bledsoe braces locked in full extension    Vital Signs: Therapy Vitals Pulse Rate: 108  (with activity) Pain: Pain Assessment Pain Assessment: 0-10 Pain Score:   4 Pain Type: Acute pain;Surgical pain Pain Location: Knee Pain Orientation: Right;Left Pain Descriptors: Aching Pain Onset: On-going Patients Stated Pain Goal: 2 Pain Intervention(s):  (premedicated, rest breaks taken) Mobility: Bed Mobility Supine to Sit: 5: Supervision Supine to Sit Details (indicate cue type and reason): set up to hand leg lifter only, working on  decreasing reliance on leg lifter, increase A/ROM BLE's Sit to Supine: 5: Supervision Sit to Supine - Details (indicate cue type and reason): same as for supine to sit Transfers Sit to Stand: 6: Modified independent (Device/Increase time);From bed;With upper extremity assist Sit to Stand Details (indicate cue type and reason): with shoes on, on tile and carpet, also from couch with armrest Stand to Sit: 6: Modified independent (Device/Increase time);With upper extremity assist Locomotion : Ambulation Ambulation Distance (Feet): 150 Feet Assistive device: Rolling walker Ambulation/Gait Assistance Details (indicate cue type and reason): cues to look up and decrease weight on UE's, mod I controlled environement, worked on household challenges with S; trial with crutches High Level Ambulation High Level Ambulation: Side stepping;Backwards walking;Direction changes;Head turns Stairs / Additional Locomotion Stairs: Yes Stairs Assistance: 5: Supervision Stairs Assistance Details: Verbal cues for technique Stair Management Technique: Two rails;Step to pattern;Forwards Number of Stairs: 4  Height of Stairs: 4       Balance: mod I with RW   Exercises:did not perform but pt verbalized which one's he is to do   Other Treatments: Treatments Therapeutic Activity: car transfer using leg lifters with increased effort and time but S/set up only to provide leg lifter and RW  See FIM for current functional status  Therapy/Group: Individual Therapy  Michaelene Song 09/30/2011, 10:26 AM

## 2011-09-30 NOTE — Progress Notes (Signed)
Subjective/Complaints: Legs were sore yesterday.  Had some raw spots from braces.  Objective: Vital Signs: Blood pressure 138/75, pulse 90, temperature 98.9 F (37.2 C), temperature source Oral, resp. rate 18, height 5\' 8"  (1.727 m), weight 126.554 kg (279 lb), SpO2 93.00%. No results found.  Basename 09/27/11 0912  WBC 15.6*  HGB 12.0*  HCT 35.5*  PLT 465*   No results found for this basename: NA:2,K:2,CL:2,CO2:2,GLUCOSE:2,BUN:2,CREATININE:2,CALCIUM:2 in the last 72 hours CBG (last 3)   Basename 09/30/11 0711 09/29/11 2037 09/29/11 1622  GLUCAP 162* 170* 146*    Wt Readings from Last 3 Encounters:  09/25/11 126.554 kg (279 lb)  09/23/11 117.6 kg (259 lb 4.2 oz)  09/23/11 117.6 kg (259 lb 4.2 oz)    Physical Exam:  General appearance: alert, cooperative and no distress Head: Normocephalic, without obvious abnormality, atraumatic Eyes: conjunctivae/corneas clear. PERRL, EOM's intact. Fundi benign. Ears: normal TM's and external ear canals both ears Nose: Nares normal. Septum midline. Mucosa normal. No drainage or sinus tenderness. Throat: lips, mucosa, and tongue normal; teeth and gums normal Neck: no adenopathy, no carotid bruit, no JVD, supple, symmetrical, trachea midline and thyroid not enlarged, symmetric, no tenderness/mass/nodules Back: symmetric, no curvature. ROM normal. No CVA tenderness. Resp: clear to auscultation bilaterally Cardio: regular rate and rhythm, S1, S2 normal, no murmur, click, rub or gallop GI: soft, non-tender; bowel sounds normal; no masses,  no organomegaly Extremities: extremities normal, atraumatic, no cyanosis or edema Pulses: 2+ and symmetric Skin: Skin color, texture, turgor normal. No rashes or lesions Neurologic: Grossly normal moves all 4's. No gross sensory loss. cogntively intact Incision/Wound: wounds dressed. In KI's. A few abrasions noted   Assessment/Plan: 1. Functional deficits secondary to bilateral quad tendon  ruptures/morbid obesity which require 3+ hours per day of interdisciplinary therapy in a comprehensive inpatient rehab setting. Physiatrist is providing close team supervision and 24 hour management of active medical problems listed below. Physiatrist and rehab team continue to assess barriers to discharge/monitor patient progress toward functional and medical goals. FIM: FIM - Bathing Bathing Steps Patient Completed: Chest;Right Arm;Left Arm;Front perineal area;Buttocks;Right upper leg;Left upper leg Bathing: 4: Min-Patient completes 8-9 76f 10 parts or 75+ percent  FIM - Upper Body Dressing/Undressing Upper body dressing/undressing steps patient completed: Put head through opening of pull over shirt/dress;Thread/unthread left sleeve of pullover shirt/dress;Thread/unthread right sleeve of pullover shirt/dresss;Pull shirt over trunk Upper body dressing/undressing: 5: Set-up assist to: Obtain clothing/put away FIM - Lower Body Dressing/Undressing Lower body dressing/undressing steps patient completed: Pull pants up/down;Don/Doff right sock;Don/Doff left sock (Able to doff socks with reacher and pull pants over hips.) Lower body dressing/undressing: 3: Mod-Patient completed 50-74% of tasks (Pt kept shorts on from previous day so did not donn over LEs)  FIM - Toileting Toileting steps completed by patient: Adjust clothing prior to toileting     FIM - Banker Devices: Therapist, occupational: 5: Bed > Chair or W/C: Supervision (verbal cues/safety issues);5: Chair or W/C > Bed: Supervision (verbal cues/safety issues)  FIM - Locomotion: Wheelchair Locomotion: Wheelchair: 5: Travels 150 ft or more: maneuvers on rugs and over door sills with supervision, cueing or coaxing FIM - Locomotion: Ambulation Locomotion: Ambulation Assistive Devices: Walker - Rolling;Orthosis Ambulation/Gait Assistance: 5: Supervision Locomotion: Ambulation: 2: Travels 50 - 149 ft  with minimal assistance (Pt.>75%)  Comprehension Comprehension Mode: Auditory Comprehension: 7-Follows complex conversation/direction: With no assist  Expression Expression Mode: Verbal Expression: 7-Expresses complex ideas: With no assist  Social Interaction Social Interaction: 6-Interacts appropriately  with others with medication or extra time (anti-anxiety, antidepressant).  Problem Solving Problem Solving: 7-Solves complex problems: Recognizes & self-corrects  Memory Memory: 7-Complete Independence: No helper  1. DVT Prophylaxis/Anticoagulation: Pharmaceutical: Lovenox , dopplers limited but negative 2. Pain Management: will schedule meds prior to therapies as currently in constant pain. Advise patient to take meds to keep pain under control.  3. Mood: Monitor for now. No signs of depression.  4. FUO:  Encourage IS every hour. Urine culture negative. D/c abx  .persistent leukocytosis. Recheck tomorrow  -recheck stable  -dopplers negative  5. DM type 2: monitor BS with ac/hs cbg checks and use SSI to keep BS under control and promote healing. Resumed amaryl. Fair control at present.   6.HTN: Monitor with bid checks. Will adjust for better control if BP remains elevated. decreased Lisinopril 7. ABLA: Will monitor for now. Add iron supplement.   9. Dyslipidemia: continue Zocor  LOS (Days) 5 A FACE TO FACE EVALUATION WAS PERFORMED  Avenly Roberge T 09/30/2011, 8:27 AM

## 2011-09-30 NOTE — Progress Notes (Signed)
Occupational Therapy Session Note  Patient Details  Name: [name] [name] MRN: [id number] Date of Birth: August 23, 1955 Today's Date: 09/30/2011  1st Session Time: 3664-4034 Pain: 4 in bilateral knees, RN aware Individual Therapy  ADL retraining w/c level at sink.  Pt did not have clean pants or shirt and declined bathing buttocks this morning.  Pt used leg lifter to assist with supine to sit EOB and transferred to w/c with r/w.  Pt requires extra time to complete tasks.  Discussed discharge plans. Pt to d/c home with sister.  Pt states that sister has BSC that can also be used in walk in shower when able to shower.  Focus on bed mobility, standing balance, and activity tolerance.   2nd session Time: 1430-1500 Pain: 6/10 in bilateral knees, RN aware Individual Therapy  Practiced walk in shower transfer and toilet transfer x 2.  Pt requires small step under bilateral legs when seated on BSC and stated that one would be available at sister's home.  Pt completes all tasks with supervision.  Pt exhibits no unsafe behavior. Pt requested to get back in bed after therapy.  Pt transferred to bed and laid down with supervision using leg lifter.  Focus on activity tolerance, safety awareness, and transfers.  Lavone Neri [facility] 09/30/2011, 3:53 PM

## 2011-10-01 DIAGNOSIS — E669 Obesity, unspecified: Secondary | ICD-10-CM

## 2011-10-01 DIAGNOSIS — M66259 Spontaneous rupture of extensor tendons, unspecified thigh: Secondary | ICD-10-CM

## 2011-10-01 DIAGNOSIS — Z5189 Encounter for other specified aftercare: Secondary | ICD-10-CM

## 2011-10-01 LAB — CBC
HCT: 34.9 % — ABNORMAL LOW (ref 39.0–52.0)
Hemoglobin: 11.5 g/dL — ABNORMAL LOW (ref 13.0–17.0)
MCH: 29.8 pg (ref 26.0–34.0)
MCV: 90.4 fL (ref 78.0–100.0)
Platelets: 548 10*3/uL — ABNORMAL HIGH (ref 150–400)
RBC: 3.86 MIL/uL — ABNORMAL LOW (ref 4.22–5.81)
WBC: 13.7 10*3/uL — ABNORMAL HIGH (ref 4.0–10.5)

## 2011-10-01 LAB — GLUCOSE, CAPILLARY: Glucose-Capillary: 161 mg/dL — ABNORMAL HIGH (ref 70–99)

## 2011-10-01 NOTE — Progress Notes (Signed)
Occupational Therapy Session Note  Patient Details  Name: Daymon Hora MRN: [id number] Date of Birth: 11-08-1955  Today's Date: 10/01/2011 Time: 1400-1430 Time Calculation (min): 30 min  Short Term Goals: Week 1:  OT Short Term Goal 1 (Week 1): Short term goals equal to long terms goals set at supervision to modified independent.  Skilled Therapeutic Interventions/Progress Updates:    Pt's sister present for family education.  Pt completed simple snack prep/kitchen task at ambulatory level with r/w, toilet transfers, and home mgmt tasks.  Discussed energy conservation strategies with pt and sister.  Pt completed all tasks safely with no assistance.  Focus on safety awareness and family education.  Pt and and sister commented they felt confident with d/c home tomorrow and had no further questions.  Therapy Documentation Precautions:  Precautions Precautions: Fall Precaution Comments: knees locked in extension Required Braces or Orthoses: Yes Knee Immobilizer: On at all times Restrictions Weight Bearing Restrictions: No RLE Weight Bearing: Weight bearing as tolerated LLE Weight Bearing: Weight bearing as tolerated Other Position/Activity Restrictions: bledsoe braces locked in full extension   Pain: Pt denies pain  See FIM for current functional status  Therapy/Group: Individual Therapy  Rich Brave 10/01/2011, 2:39 PM

## 2011-10-01 NOTE — Progress Notes (Signed)
Occupational Therapy Session Note  Patient Details  Name: [name] [name] MRN: [id number] Date of Birth: [birth year]  Today's Date: 10/01/2011 Time: 0800-0845 Time Calculation (min): 45 min  Short Term Goals: Week 1:  OT Short Term Goal 1 (Week 1): Short term goals equal to long terms goals set at supervision to modified independent.  Skilled Therapeutic Interventions/Progress Updates:    ADL retraining including bathing and dressing w/c level at sink.  Pt uses AE PRN to assist with tasks.  Pt states he will stand for most of bathing and dressing at home only sitting on BSC to remove pants and thread pants legs.  Pt amb in room to gather clothing using AE to gather from drawers.  Focus on discharge planning, activity tolerance, energy conservation strategies, and safety awareness.  Therapy Documentation Precautions:  Precautions Precautions: Fall Precaution Comments: knees locked in extension Required Braces or Orthoses: Yes Knee Immobilizer: On at all times Restrictions Weight Bearing Restrictions: No RLE Weight Bearing: Weight bearing as tolerated LLE Weight Bearing: Weight bearing as tolerated Other Position/Activity Restrictions: bledsoe braces locked in full extension  Pain: Pain Assessment Pain Assessment: 0-10 Pain Score:   5 Pain Type: Acute pain Pain Location: Knee Pain Orientation: Right, Left Pain Descriptors: Spasm;Aching Pain Frequency: Occasional Pain Onset: With Activity Patients Stated Pain Goal: 2 Pain Intervention(s): RN Aware    See FIM for current functional status  Therapy/Group: Individual Therapy  Rich Brave 10/01/2011, 9:46 AM

## 2011-10-01 NOTE — Progress Notes (Signed)
Occupational Therapy Discharge Summary  Patient Details  Name: Deroy Noah MRN: [id number] Date of Birth: 1956-04-29  Today's Date: 10/02/2011  Patient has met 9 of 9 long term goals due to improved activity tolerance, improved balance, ability to compensate for deficits, improved awareness and improved coordination.  Pt is supervision/set-up level for bathing, walk-in shower transfers, and LB dsg, independent for UB dsg, and mod I for toilet transfers and toileting.  Pt uses AE PRN for bathing and dressing tasks. Pt will d/c to sister's home temporarily. Pt's sister has completed education.  Patient to discharge at overall supervision - mod I level.  Patient's care partner is independent to provide the necessary supervision prn at discharge.    Reasons goals not met: N/A; all goals met at this time.  Recommendation: No additional occupational therapy recommended at this time.   Equipment: No equipment provided; patient states he currently has access to needed equipment.   Reasons for discharge: treatment goals met and discharge from hospital  Patient/family agrees with progress made and goals achieved: Yes  ADL ADL Equipment Provided: Reacher;Sock aid;Long-handled sponge Grooming: Independent Where Assessed-Grooming: Sitting at sink;Wheelchair Upper Body Bathing: Setup Where Assessed-Upper Body Bathing: Sitting at sink Lower Body Bathing: Setup Where Assessed-Lower Body Bathing: Standing at sink;Sitting at sink;Wheelchair Upper Body Dressing: Independent Where Assessed-Upper Body Dressing: Sitting at sink;Wheelchair Lower Body Dressing: Supervision/safety Where Assessed-Lower Body Dressing: Standing at sink;Sitting at sink;Wheelchair Toileting: Modified independent Where Assessed-Toileting: Toilet;Bedside Commode Toilet Transfer: Modified independent Toilet Transfer Method: Proofreader: Psychiatric nurse: Close supervision Lexicographer Method: Designer, industrial/product: Information systems manager without back  SEE DISCHARGE NAVIGATOR FOR MORE INFORMATION.Edwin Cap 10/02/2011, 8:04 AM

## 2011-10-01 NOTE — Plan of Care (Signed)
Problem: RH PAIN MANAGEMENT Goal: RH STG PAIN MANAGED AT OR BELOW PT'S PAIN GOAL 4 or less on scale of 1-10.  Outcome: Not Progressing Pt pain level seems to be high whenever he is ask the level. Nurse try to keep pt on a routine

## 2011-10-01 NOTE — Progress Notes (Signed)
Physical Therapy Session Note  Patient Details  Name: [name] [name] MRN: [id number] Date of Birth: June 21, 1956  Today's Date: 10/01/2011 Time: 0905-1002 Time Calculation (min): 57 min  Short Term Goals: Week 1:   d/c goals  Skilled Therapeutic Interventions/Progress Updates:    Preparation for d/c, maximizing functional I with functional efficiency.  Pt mod I on regular bed with leg lifter, Mod I transfers with RW and mod I gait in controlled and household environment and steps with 1 or 2 rails modI. Still Patient at increased fall risk as noted by decreased gait velocity.  (increased fall risk with velocity less than 1.8 ft/sec). Patient current velocity is  .9 ft per sec but this is improved from .2 . Performed standing PF with RW for support 15 x 2 for strengthening.  Therapy Documentation Precautions:  Precautions Precautions: Fall Precaution Comments: knees locked in extension Required Braces or Orthoses: Yes Knee Immobilizer: On at all times Restrictions Weight Bearing Restrictions: No RLE Weight Bearing: Weight bearing as tolerated LLE Weight Bearing: Weight bearing as tolerated Other Position/Activity Restrictions: bledsoe braces locked in full extension    Vital Signs:  130 HR with steps Pain: Pain Assessment Pain Assessment: 0-10 Pain Score:   6 Pain Type: Acute pain Pain Location: Knee Pain Orientation: Right Pain Descriptors: Aching Pain Frequency: Occasional Pain Onset: Gradual Patients Stated Pain Goal: 2 Pain Intervention(s): Medication (See eMAR) Nurse informed   Locomotion : Ambulation Ambulation: Yes Ambulation/Gait Assistance: 6: Modified independent (Device/Increase time) Assistive device: Rolling walker Gait Gait: Yes Gait Pattern: Impaired Gait Pattern: Step-to pattern;Decreased stride length;Trunk flexed Gait velocity: .9 ft/sec Stairs / Additional Locomotion Stairs: Yes Stairs Assistance: 5: Supervision Stairs Assistance Details: Verbal  cues for technique Stair Management Technique: Two rails;One rail Left;Step to pattern;Forwards;Sideways Number of Stairs: 12  Height of Stairs: 5    See FIM for current functional status  Therapy/Group: Individual Therapy  Michaelene Song 10/01/2011, 12:12 PM

## 2011-10-01 NOTE — Progress Notes (Signed)
Patient ID: [name] [name], male   DOB: 03/02/1956, 56 y.o.   MRN: [id number]  Subjective/Complaints: No new complaints. Sleeping better.  Objective: Vital Signs: Blood pressure 104/69, pulse 81, temperature 98.5 F (36.9 C), temperature source Oral, resp. rate 18, height 5\' 8"  (1.727 m), weight 126.554 kg (279 lb), SpO2 91.00%. No results found.  Basename 10/01/11 0650  WBC 13.7*  HGB 11.5*  HCT 34.9*  PLT 548*   No results found for this basename: NA:2,K:2,CL:2,CO2:2,GLUCOSE:2,BUN:2,CREATININE:2,CALCIUM:2 in the last 72 hours CBG (last 3)   Basename 10/01/11 0713 09/30/11 2056 09/30/11 1617  GLUCAP 166* 170* 147*    Wt Readings from Last 3 Encounters:  09/25/11 126.554 kg (279 lb)  09/23/11 117.6 kg (259 lb 4.2 oz)  09/23/11 117.6 kg (259 lb 4.2 oz)    Physical Exam:  General appearance: alert, cooperative and no distress Head: Normocephalic, without obvious abnormality, atraumatic Eyes: conjunctivae/corneas clear. PERRL, EOM's intact. Fundi benign. Ears: normal TM's and external ear canals both ears Nose: Nares normal. Septum midline. Mucosa normal. No drainage or sinus tenderness. Throat: lips, mucosa, and tongue normal; teeth and gums normal Neck: no adenopathy, no carotid bruit, no JVD, supple, symmetrical, trachea midline and thyroid not enlarged, symmetric, no tenderness/mass/nodules Back: symmetric, no curvature. ROM normal. No CVA tenderness. Resp: clear to auscultation bilaterally Cardio: regular rate and rhythm, S1, S2 normal, no murmur, click, rub or gallop GI: soft, non-tender; bowel sounds normal; no masses,  no organomegaly Extremities: extremities normal, atraumatic, no cyanosis or edema Pulses: 2+ and symmetric Skin: Skin color, texture, turgor normal. No rashes or lesions Neurologic: Grossly normal moves all 4's. No gross sensory loss. cogntively intact Incision/Wound: wounds dressed. In KI's. A few abrasions noted without  drainage   Assessment/Plan: 1. Functional deficits secondary to bilateral quad tendon ruptures/morbid obesity which require 3+ hours per day of interdisciplinary therapy in a comprehensive inpatient rehab setting. Physiatrist is providing close team supervision and 24 hour management of active medical problems listed below. Physiatrist and rehab team continue to assess barriers to discharge/monitor patient progress toward functional and medical goals. FIM: FIM - Bathing Bathing Steps Patient Completed: Chest;Right Arm;Left Arm;Abdomen (pt declined LB bathing) Bathing: 4: Min-Patient completes 8-9 52f 10 parts or 75+ percent  FIM - Upper Body Dressing/Undressing Upper body dressing/undressing steps patient completed: Thread/unthread right sleeve of pullover shirt/dresss;Thread/unthread left sleeve of pullover shirt/dress;Put head through opening of pull over shirt/dress;Pull shirt over trunk Upper body dressing/undressing: 5: Set-up assist to: Obtain clothing/put away FIM - Lower Body Dressing/Undressing Lower body dressing/undressing steps patient completed: Pull pants up/down;Don/Doff right sock;Don/Doff left sock (Able to doff socks with reacher and pull pants over hips.) Lower body dressing/undressing:  (pt declined LB dressing - no clean clothes)  FIM - Toileting Toileting steps completed by patient: Adjust clothing prior to toileting  FIM - Diplomatic Services operational officer Devices: Bedside commode;Elevated toilet seat Toilet Transfers: 5-To toilet/BSC: Supervision (verbal cues/safety issues);5-From toilet/BSC: Supervision (verbal cues/safety issues)  FIM - Banker Devices: [facility] elevated;Walker Bed/Chair Transfer: 5: Supine > Sit: Supervision (verbal cues/safety issues);5: Sit > Supine: Supervision (verbal cues/safety issues);6: Chair or W/C > Bed: No assist;6: Bed > Chair or W/C: No assist  FIM - Locomotion: Wheelchair Locomotion:  Wheelchair: 5: Travels 150 ft or more: maneuvers on rugs and over door sills with supervision, cueing or coaxing FIM - Locomotion: Ambulation Locomotion: Ambulation Assistive Devices: Designer, industrial/product Ambulation/Gait Assistance: 5: Supervision Locomotion: Ambulation: 5: Travels 150 ft or more with  supervision/safety issues  Comprehension Comprehension Mode: Auditory Comprehension: 7-Follows complex conversation/direction: With no assist  Expression Expression Mode: Verbal Expression: 7-Expresses complex ideas: With no assist  Social Interaction Social Interaction: 7-Interacts appropriately with others - No medications needed.  Problem Solving Problem Solving: 7-Solves complex problems: Recognizes & self-corrects  Memory Memory: 7-Complete Independence: No helper  1. DVT Prophylaxis/Anticoagulation: Pharmaceutical: Lovenox , dopplers limited but negative 2. Pain Management: reasonable control at most times 3. Mood: Monitor for now. No signs of depression.  4. FUO:  Encourage IS every hour. Urine culture negative. D/c abx  .persistent leukocytosis. Recheck wbc's down today  -no clinical signs of infection  -dopplers negative  5. DM type 2:  Resumed amaryl. Fair control at present.   6.HTN: Monitor with bid checks. Will adjust for better control if BP remains elevated. decreased Lisinopril 7. ABLA: Will monitor for now. Added iron supplement.   9. Dyslipidemia: continue Zocor  LOS (Days) 6 A FACE TO FACE EVALUATION WAS PERFORMED  Areeb Corron T 10/01/2011, 8:53 AM

## 2011-10-01 NOTE — Progress Notes (Signed)
Physical Therapy Discharge Summary  Patient Details  Name: [name] [name] MRN: [id number] Date of Birth: 11/23/1955  Today's Date: 10/01/2011 Time: l1300-1358 Skilled intervention:Education with pt's sister regarding f/u therapy, current exercise program (she I assisted pt), folding w/c,  home safety recommendations, activity tolerance and she observed him perform all mobility, including car transfer mod I.  Pt/sister I verbalize and demonstrate understanding of donning/doffing leg braces. Individual therapy Pain: 5/10 premedicated no further intervention required  Patient has met 9 of 9 long term goals due to improved activity tolerance, improved balance, increased strength, ability to compensate for deficits and functional use of  right lower extremity and left lower extremity.  Patient to discharge at an ambulatory level Modified Independent.  wearing B bledsoe braces locked in knee extension using RW and leg lifter also. Patient's care partner is independent to provide the necessary physical assistance at discharge.(donning/doffing braces)  Reasons goals not met: n/a  Recommendation:  Patient will benefit from ongoing skilled PT services in home health setting to continue to assess safety in the home and advance safe functional mobility, address ongoing impairments in activity tolerance, pain, LE weakness, and minimize fall risk.  Pt PT needs will be more once he is permitted ROM in knees and he will benefit from OPPT at that point.  Equipment: wide RW, w/c with ELR and height adjustable arms, basic w/c cushion, leg lifter  Reasons for discharge: treatment goals met  Patient/family agrees with progress made and goals achieved: Yes  PT Discharge Precautions/Restrictions  WBAT, bledsoe braces on at all times and locked in extension at knees    Cognition Overall Cognitive Status: Appears within functional limits for tasks assessed Sensation Sensation Light Touch: Appears  Intact Proprioception: Appears Intact Coordination Gross Motor Movements are Fluid and Coordinated: Yes Fine Motor Movements are Fluid and Coordinated: Yes Motor  Motor Motor - Discharge Observations: postural control impaired by knees being locked in extension, otherwise not impaired     Balance Static Sitting Balance Static Sitting - Level of Assistance: 6: Modified independent (Device/Increase time) Dynamic Sitting Balance Dynamic Sitting - Level of Assistance: 6: Modified independent (Device/Increase time) Static Standing Balance Static Standing - Level of Assistance: 6: Modified independent (Device/Increase time) Dynamic Standing Balance Dynamic Standing - Level of Assistance: 6: Modified independent (Device/Increase time) Extremity Assessment      RLE PROM (degrees) RLE Overall PROM Comments: mod A for SLR, knee NT in brace RLE Strength RLE Overall Strength Comments: hip 2-/5 flexion, abduction 3/5 atleast in standing but trouble overcoming friction of brace in supine, anle WFL LLE PROM (degrees) LLE Overall PROM Comments: min to mod A for SLR, stronger than R and less painful LLE Strength LLE Overall Strength Comments: ankle WFL, knee NT in brace, hip 2-/5  See FIM for current functional status  Michaelene Song 10/01/2011, 4:24 PM

## 2011-10-02 LAB — GLUCOSE, CAPILLARY
Glucose-Capillary: 159 mg/dL — ABNORMAL HIGH (ref 70–99)
Glucose-Capillary: 217 mg/dL — ABNORMAL HIGH (ref 70–99)

## 2011-10-02 MED ORDER — POLYSACCHARIDE IRON COMPLEX 150 MG PO CAPS
150.0000 mg | ORAL_CAPSULE | Freq: Every day | ORAL | Status: DC
  Administered 2011-10-02: 150 mg via ORAL
  Filled 2011-10-02 (×2): qty 1

## 2011-10-02 MED ORDER — OXYCODONE HCL 5 MG PO TABS: 5.0000 mg | ORAL_TABLET | ORAL | Status: AC | PRN

## 2011-10-02 MED ORDER — POLYSACCHARIDE IRON COMPLEX 150 MG PO CAPS: 150.0000 mg | ORAL_CAPSULE | Freq: Every day | ORAL | Status: AC

## 2011-10-02 MED ORDER — LISINOPRIL 10 MG PO TABS: 10.0000 mg | ORAL_TABLET | Freq: Every day | ORAL | Status: AC

## 2011-10-02 MED ORDER — METHOCARBAMOL 500 MG PO TABS: 500.0000 mg | ORAL_TABLET | Freq: Four times a day (QID) | ORAL | Status: AC | PRN

## 2011-10-02 MED ORDER — HYDROCHLOROTHIAZIDE 25 MG PO TABS: 25.0000 mg | ORAL_TABLET | Freq: Every day | ORAL | Status: AC

## 2011-10-02 MED ORDER — DSS 100 MG PO CAPS: 100.0000 mg | ORAL_CAPSULE | Freq: Two times a day (BID) | ORAL | Status: AC

## 2011-10-02 MED ORDER — SENNOSIDES-DOCUSATE SODIUM 8.6-50 MG PO TABS: 2.0000 | ORAL_TABLET | Freq: Every day | ORAL | Status: AC

## 2011-10-02 MED ORDER — ACETAMINOPHEN 325 MG PO TABS: 325.0000 mg | ORAL_TABLET | ORAL | Status: AC | PRN

## 2011-10-02 MED ORDER — TRAZODONE HCL 100 MG PO TABS: 100.0000 mg | ORAL_TABLET | Freq: Every day | ORAL | Status: AC

## 2011-10-02 MED ORDER — ASPIRIN EC 325 MG PO TBEC: DELAYED_RELEASE_TABLET | ORAL | Status: AC

## 2011-10-02 NOTE — Patient Care Conference (Signed)
Inpatient RehabilitationTeam Conference Note Date: 10/01/2011   Time: 2:00 PM    Patient Name: [name] [name]      Medical Record Number: [id number]  Date of Birth: 27-Aug-1955 Sex: Male         Room/Bed: 4147/4147-01 Payor Info: Payor: [employer]  Plan: [employer]  Product Type: *No Product type*     Admitting Diagnosis: BIL QUAD TENDON RUPTURE  Admit Date/Time:  09/25/2011  2:24 PM Admission Comments: No comment available   Primary Diagnosis:  Quadriceps tendon rupture Principal Problem: Quadriceps tendon rupture  Patient Active Problem List  Diagnoses Date Noted  . Obesity, Class III, BMI 40-49.9 (morbid obesity) 09/26/2011  . Quadriceps tendon rupture 09/23/2011  . HTN (hypertension) 09/23/2011  . DM (diabetes mellitus) 09/23/2011  . Hyperlipidemia 09/23/2011    Expected Discharge Date: Expected Discharge Date: 10/02/11  Team Members Present: Physician: Dr. Faith Rogue Case Manager Present: Melanee Spry, RN Social Worker Present: Amada Jupiter, LCSW Nurse Present: Carmie End, RN PT Present: Karolee Stamps, Judith Blonder, PTA OT Present: Edwin Cap, Felipa Eth, OT SLP Present: Feliberto Gottron, SLP Other (Discipline and Name): Tora Duck, PPS Coordinator     Current Status/Progress Goal Weekly Team Focus  Medical   Bilateral quadriceps tendon ruptures. Wounds are healing nicely. Pain is under better control  Maintain the range of motion precautions  Skin care and pain control   Bowel/Bladder   Continent of Bowel and Bladder, uses urinal or BR  Continent of Bowel and Bladder      Swallow/Nutrition/ Hydration   CM medium-no raw vegetables or salad         ADL's   supervision overall  supervision bathing, LB dsg, and shower transfer; mod I toilet transfers and toileting A w/ leg braces safety awareness   Mobility     Supervision-Mod I   Mod I w/ leg lifter & RW & w/ leg braces on locked in extension     Communication   able to make his needs  known  make needs known to staff and family      Safety/Cognition/ Behavioral Observations  no unsafe behavior, WBAT to bilateral knees, bledsoe braces on bilaterally  no [name] with injury      Pain   4-6 on scale of 1-10, right knee worse than left, scheduled oxy IR 10 mg po at 0700 and 1200 noon, with prn oxy Ir and Robaxin and ultram as needed  3 or less on scale of 1-10      Skin   bilateral knee incisions with gauze dressing, bruising to arms and abdomen  no infection in knee incisions or breakdown         *See Interdisciplinary Assessment and Plan and progress notes for long and short-term goals  Barriers to Discharge:   none   Possible Resolutions to Barriers:  None    Discharge Planning/Teaching Needs:  home with sister and brother-in-law who can provide any needed assistance (but pt with mod i goals)      Team Discussion: Ready for d/c 10/02/11.  Family ed completed.   Revisions to Treatment Plan: none    Continued Need for Acute Rehabilitation Level of Care: The patient requires daily medical management by a physician with specialized training in physical medicine and rehabilitation for the following conditions: Daily direction of a multidisciplinary physical rehabilitation program to ensure safe treatment while eliciting the highest outcome that is of practical value to the patient.: Yes Daily medical management of patient stability  for increased activity during participation in an intensive rehabilitation regime.: Yes Daily analysis of laboratory values and/or radiology reports with any subsequent need for medication adjustment of medical intervention for : Post surgical problems;Other  [name] [name] 10/02/2011, 11:49 AM

## 2011-10-02 NOTE — Progress Notes (Signed)
Patient ID: [name] [name], male   DOB: [birth year], 56 y.o.   MRN: [id number]  Subjective/Complaints: Doing well. No complaints. Excited to go home  Objective: Vital Signs: Blood pressure 112/71, pulse 82, temperature 97.9 F (36.6 C), temperature source Oral, resp. rate 18, height 5\' 8"  (1.727 m), weight 126.554 kg (279 lb), SpO2 92.00%. No results found.  Basename 10/01/11 0650  WBC 13.7*  HGB 11.5*  HCT 34.9*  PLT 548*   No results found for this basename: NA:2,K:2,CL:2,CO2:2,GLUCOSE:2,BUN:2,CREATININE:2,CALCIUM:2 in the last 72 hours CBG (last 3)   Basename 10/02/11 0709 10/01/11 2041 10/01/11 1607  GLUCAP 159* 161* 128*    Wt Readings from Last 3 Encounters:  09/25/11 126.554 kg (279 lb)  09/23/11 117.6 kg (259 lb 4.2 oz)  09/23/11 117.6 kg (259 lb 4.2 oz)    Physical Exam:  General appearance: alert, cooperative and no distress Head: Normocephalic, without obvious abnormality, atraumatic Eyes: conjunctivae/corneas clear. PERRL, EOM's intact. Fundi benign. Ears: normal TM's and external ear canals both ears Nose: Nares normal. Septum midline. Mucosa normal. No drainage or sinus tenderness. Throat: lips, mucosa, and tongue normal; teeth and gums normal Neck: no adenopathy, no carotid bruit, no JVD, supple, symmetrical, trachea midline and thyroid not enlarged, symmetric, no tenderness/mass/nodules Back: symmetric, no curvature. ROM normal. No CVA tenderness. Resp: clear to auscultation bilaterally Cardio: regular rate and rhythm, S1, S2 normal, no murmur, click, rub or gallop GI: soft, non-tender; bowel sounds normal; no masses,  no organomegaly Extremities: extremities normal, atraumatic, no cyanosis or edema Pulses: 2+ and symmetric Skin: Skin color, texture, turgor normal. No rashes or lesions Neurologic: Grossly normal moves all 4's. No gross sensory loss. cogntively intact Incision/Wound: wounds dressed. In KI's. A few abrasions noted without  drainage   Assessment/Plan: 1. Functional deficits secondary to bilateral quad tendon ruptures/morbid obesity which require 3+ hours per day of interdisciplinary therapy in a comprehensive inpatient rehab setting. Physiatrist is providing close team supervision and 24 hour management of active medical problems listed below. Physiatrist and rehab team continue to assess barriers to discharge/monitor patient progress toward functional and medical goals.  Rehab Goals met FIM: FIM - Bathing Bathing Steps Patient Completed: Chest;Right Arm;Left Arm;Abdomen;Front perineal area;Buttocks;Right lower leg (including foot);Left upper leg;Right upper leg;Left lower leg (including foot) Bathing: 5: Supervision: Safety issues/verbal cues  FIM - Upper Body Dressing/Undressing Upper body dressing/undressing steps patient completed: Thread/unthread right sleeve of pullover shirt/dresss;Thread/unthread left sleeve of pullover shirt/dress;Put head through opening of pull over shirt/dress;Pull shirt over trunk Upper body dressing/undressing: 7: Complete Independence: No helper FIM - Lower Body Dressing/Undressing Lower body dressing/undressing steps patient completed: Thread/unthread right pants leg;Thread/unthread left pants leg;Pull pants up/down;Fasten/unfasten pants;Don/Doff left sock;Don/Doff right shoe;Don/Doff left shoe Lower body dressing/undressing: 5: Supervision: Safety issues/verbal cues  FIM - Toileting Toileting steps completed by patient: Adjust clothing prior to toileting;Performs perineal hygiene;Adjust clothing after toileting Toileting: 6: More than reasonable amount of time  FIM - Diplomatic Services operational officer Devices: Bedside commode;Elevated toilet seat;Walker Toilet Transfers: 6-Assistive device: No helper;6-To toilet/ BSC;6-From toilet/BSC  FIM - Banker Devices: [facility] elevated;Walker Bed/Chair Transfer: 5: Supine > Sit:  Supervision (verbal cues/safety issues);5: Sit > Supine: Supervision (verbal cues/safety issues);6: Chair or W/C > Bed: No assist;6: Bed > Chair or W/C: No assist  FIM - Locomotion: Wheelchair Locomotion: Wheelchair: 5: Travels 150 ft or more: maneuvers on rugs and over door sills with supervision, cueing or coaxing FIM - Locomotion: Ambulation Locomotion: Ambulation Assistive Devices: Designer, industrial/product  Ambulation/Gait Assistance: 6: Modified independent (Device/Increase time) Locomotion: Ambulation: 5: Travels 150 ft or more with supervision/safety issues  Comprehension Comprehension Mode: Auditory Comprehension: 7-Follows complex conversation/direction: With no assist  Expression Expression Mode: Verbal Expression: 7-Expresses complex ideas: With no assist  Social Interaction Social Interaction: 7-Interacts appropriately with others - No medications needed.  Problem Solving Problem Solving: 7-Solves complex problems: Recognizes & self-corrects  Memory Memory: 7-Complete Independence: No helper  1. DVT Prophylaxis/Anticoagulation: Pharmaceutical: Lovenox , dopplers limited but negative.  Use ASA for prophylaxis past discharge.    2. Pain Management: reasonable control at most times 3. Mood: Monitor for now. No signs of depression.  4. FUO:  Resolved. Afebrile since admission.  -no clinical signs of infection  -dopplers negative  5. DM type 2:  Resumed amaryl. Fair control at present.  Increase to home dose of 4 mg bid.  6.HTN: Monitor with bid checks. Well controlled on lower dose of lisinopril 7. ABLA: Iron supplement past discharge. 8. Leucocytosis:  Reactive.  Resolving.  No fevers or signs of infection.  9. Dyslipidemia: continue Zocor  LOS (Days) 7 A FACE TO FACE EVALUATION WAS PERFORMED Ivory Broad, MD Delle Reining S 10/02/2011, 9:27 AM

## 2011-10-02 NOTE — Progress Notes (Signed)
Social Work  Discharge Note  The overall goal for the admission was met for:   Discharge location: Yes - home with sister  Length of Stay: Yes - 7 days  Discharge activity level: Yes - modified independent to minimal assit overall   Home/community participation: Yes  Services provided included: MD, RD, PT, OT, RN, CM, TR, Pharmacy and SW  Financial Services: Worker's Comp  Follow-up services arranged: Home Health: PT via [facility] , DME: 18x18 breezy w/c with ELRs and ht adj. arms, cushion via [facility] and Patient/Family has no preference for HH/DME agencies  Comments (or additional information):  Patient/Family verbalized understanding of follow-up arrangements: Yes  Individual responsible for coordination of the follow-up plan: patient  Confirmed correct DME delivered: Avaleigh Decuir 10/02/2011    Abubakr Wieman

## 2011-10-02 NOTE — Plan of Care (Signed)
Problem: RH SKIN INTEGRITY Goal: RH STG ABLE TO PERFORM INCISION/WOUND CARE W/ASSISTANCE STG Able To Perform Incision/Wound Care With Assistance.Min assist of caregiver  Outcome: Completed/Met Date Met:  10/02/11 Sister assisted with dressing change today, denies questions or concerned pt stated" we got this"

## 2011-10-02 NOTE — Discharge Instructions (Signed)
Inpatient Rehab Discharge Instructions  [name] [name] Discharge date and time: 10/02/11   Activities/Precautions/ Functional Status: Activity: activity as tolerated Diet: regular diet and diabetic diet Wound Care: keep wound clean and dry Functional status:  ___ No restrictions     ___ Walk up steps independently ___ 24/7 supervision/assistance   ___ Walk up steps with assistance _X__ Intermittent supervision/assistance  ___ Bathe/dress independently ___ Walk with walker     _X__ Bathe/dress with assistance _X__ Walk Independently    ___ Shower independently ___ Walk with assistance    ___ Shower with assistance _X__ No alcohol     ___ Return to work/school ________  COMMUNITY REFERRALS UPON DISCHARGE:    Home Health:   PT                    Agency: [facility] Phone: [phone]   Medical Equipment/Items Ordered: wheelchair and cushion, wide rolling walker                                                                 Agency/Supplier: [facility] [phone]      GENERAL COMMUNITY RESOURCES FOR PATIENT/FAMILY: Support Groups:***  N/A:***  Employment Assistance:***   N/A:*** Caregiver Support:***  N/A:*** Education Assistance:***   N/A:*** Mental Health:***   N/A:***  Home Modification:***   N/A:***     Special Instructions: No driving till cleared by MD. Check blood sugars at least twice a day before meals.  My questions have been answered and I understand these instructions. I will adhere to these goals and the provided educational materials after my discharge from the hospital.  Patient/Caregiver Signature _______________________________ Date __________  Clinician Signature _______________________________________ Date __________  Please bring this form and your medication list with you to all your follow-up doctor's appointments.

## 2011-10-02 NOTE — Progress Notes (Signed)
Discharge summary # [phone]

## 2011-10-02 NOTE — Progress Notes (Signed)
Patient and 2 sisters received verbal and written discharge instructions by Marissa Nestle, PA. Patient and sisters deny any questions or concerns, aware of follow up appointments. Patient received 2 oxy IR tablets for 5 out of 10 pain scale as ordered by MD. Personal belongings packed by family. Patient taken down via wheelchair by NT to private vehicle. [name] [name], [name] [name]

## 2011-10-03 NOTE — Discharge Summary (Signed)
NAMEBARLOW, [name] NO.:  [id number]  MEDICAL RECORD NO.:  [id number]  LOCATION:  4147                         FACILITY:  [facility]  PHYSICIAN:  Ranelle Oyster, M.D.DATE OF BIRTH:  13-Apr-1956  DATE OF ADMISSION:  09/25/2011 DATE OF DISCHARGE:  10/02/2011                              DISCHARGE SUMMARY   DISCHARGE DIAGNOSES: 1. Bilateral quadriceps tendon rupture with repair. 2. Diabetes. 3. Hypertension. 4. Morbid obesity.  HISTORY OF PRESENT ILLNESS:  [name] is a 56 year old male who had a mechanical fall past slipping on wet steps at work.  He heard a pop in right knee, then left, and sustained bilateral quadriceps tendon rupture.  He was evaluated by Dr. Lajoyce Corners and underwent a quad tendon repair on 4:01 p.m.  Postop, he is weightbearing as tolerated with Bledsoe brace is to be on for ambulation and to avoid range of motion.  The patient was evaluated by Rehab and we felt that he would benefit from a CIR program.  PAST MEDICAL HISTORY:  Hypertension and diabetes.  FUNCTIONAL HISTORY:  The patient was independent with ADLs and mobility. He works full-time.  FUNCTIONAL STATUS:  The patient is max-assist for sit to supine.  Mod- assist for transfers.  Min-assist for ambulating 3 feet.  The patient is at setup-assist for upper body care, max-assist for lower body care, moderate-assist for toileting.  He requires max-assist for lower body care.  PERTINENT LABS:  Check of lytes revealed sodium 132, potassium 3.7, chloride 94, CO2 26, BUN 19, creatinine 0.83.  Albumin was low at 3.0. Check of CBC last of October 01, 2011, reveals hemoglobin 11.5, hematocrit 34.9, white count 13.7, platelets 528.  HOSPITAL COURSE:  [name] was admitted to Rehab on September 25, 2011, for inpatient therapies to consist of PT, OT at least 3 hours 5 days a week.  Past-admission, physiatrist, rehab RN, and therapy team have worked together to provide customized  collaborative interdisciplinary care.  Rehab RN has worked with the patient on bowel and bladder program as well as assistance with med management especially for pain control issues.  The patient's blood pressures were monitored on b.i.d. basis.  These were noted to be running on the low side; therefore, his lisinopril was decreased to 10 mg a day.  CBGs were monitored on before meal and at bedtime basis.  His Amaryl was resumed past-admission.  Blood sugars at time of discharge are ranging at 120s to 170s range overall.  His Amaryl was increased to 8 mg per day at time of discharge.  The patient was noted to have leukocytosis at time of admission with white count at 17.6.  UA/UC was ordered.  Urine culture was negative.  The patient's wounds are healing well without any signs or symptoms of infection.  Staples remain intact.  CBC prior to discharge shows leukocytosis to be on a downward trend.  He has been afebrile during this stay.  The patient's p.o. intake has been good.  Bowel program was initiated by nursing to help with constipation issues.    During the patient's stay in rehab, team conference was held to monitor the patient's progress, set  goals, as well as discuss barriers to discharge.  Physical Therapy was initiated, and overall, the patient is showing improvement in activity tolerance, balance as well as ability to compensate for deficits.  Currently, the patient is at supervision to setup-assist for bathing and for walk-in shower transfers.  He is independent for upper body dressing, mod independent for toileting and toilet transfers.  He uses Adaptic equipment as needed for bathing and dressing tasks.  Energy conservation strategies have been discussed with the patient and sister.  Family education was done with focus on safety awareness and supervision needed.  The patient has made good progress and is currently at modified independent level for transfers, modified independent level  for ambulating household distances.  He is modified independent for navigating steps with 1-2 rails.  The patient and sister have been educated regarding donning and doffing leg braces to perform hygiene.  He is aware of need to wear bilateral Bledsoe braces, locked the knee extension at all times.  He is utilizing leg lifter to help with lower extremity mobility on and off surfaces. Followup home health PT to be done by [facility].  On October 02, 2011, the patient was discharged to home.  DISCHARGE MEDICATIONS: 1. Tylenol 325-650 mg p.o. q.4 hours p.r.n. mild pain. 2. Coated aspirin 325 mg p.o. per day. 3. Colace 100 mg b.i.d. 4. Hydrochlorothiazide 25 mg a day. 5. Niferex 150 mg a day. 6. Lisinopril 10 mg a day. 7. Robaxin 500 mg q.6 hours p.r.n. spasms 8. OxyIR 5 mg 1-2 p.o. q.4 hours p.r.n. moderate-to-severe pain, #60     Rx. 9. Senokot-S 2 p.o. at bedtime. 10.Trazodone 100 mg p.o. at bedtime. 11.Amaryl 4 mg b.i.d. 12.Metformin 1000 mg in evening and 1500 mg at nighttime. 13.Zocor 20 mg q.p.m.  Diet: Carb modified medium.  Activity level: Intermittent supervision. Activity as tolerated.  WOUND CARE:  Keep the area clean and dry.  SPECIAL INSTRUCTIONS:  No alcohol.  [facility] to provide PT.  No driving until cleared by MD.  Check blood sugars at least b.i.d. basis.  FOLLOWUP:  The patient to follow up with Dr. Aldean Baker next week for postop check and for staple removal.  Follow up with Dr. Burnett Sheng for routine check to include check of CBC for follow up on his acute blood loss anemia.  Follow up with Dr. Riley Kill as needed.     Delle Reining, P.A.   ______________________________ Ranelle Oyster, M.D.    PL/MEDQ  D:  10/02/2011  T:  10/03/2011  Job:  [id number]  cc:   [name] Mustard, MD [name] Mina, MD

## 2011-10-11 ENCOUNTER — Ambulatory Visit: Payer: Self-pay | Admitting: Internal Medicine

## 2011-10-11 LAB — CBC CANCER CENTER
Basophil #: 0.1 x10 3/mm (ref 0.0–0.1)
Basophil %: 0.7 %
Eosinophil %: 3.4 %
HCT: 38.1 % — ABNORMAL LOW (ref 40.0–52.0)
Lymphocyte #: 2.2 x10 3/mm (ref 1.0–3.6)
Lymphocyte %: 18.3 %
MCH: 29.5 pg (ref 26.0–34.0)
Neutrophil %: 72.1 %
Platelet: 635 x10 3/mm — ABNORMAL HIGH (ref 150–440)
RBC: 4.16 10*6/uL — ABNORMAL LOW (ref 4.40–5.90)
WBC: 12 x10 3/mm — ABNORMAL HIGH (ref 3.8–10.6)

## 2011-10-23 ENCOUNTER — Ambulatory Visit: Payer: Self-pay | Admitting: Internal Medicine

## 2011-10-23 ENCOUNTER — Encounter: Payer: Self-pay | Admitting: Physical Medicine & Rehabilitation

## 2011-12-06 ENCOUNTER — Ambulatory Visit: Payer: Self-pay | Admitting: Internal Medicine

## 2011-12-06 LAB — CBC CANCER CENTER
Basophil #: 0.1 x10 3/mm (ref 0.0–0.1)
Eosinophil #: 0.6 x10 3/mm (ref 0.0–0.7)
HCT: 42.4 % (ref 40.0–52.0)
HGB: 13.5 g/dL (ref 13.0–18.0)
Lymphocyte %: 18.5 %
MCHC: 32 g/dL (ref 32.0–36.0)
MCV: 92 fL (ref 80–100)
Monocyte #: 0.9 x10 3/mm (ref 0.2–1.0)
Monocyte %: 7.9 %
Neutrophil #: 7.9 x10 3/mm — ABNORMAL HIGH (ref 1.4–6.5)
Neutrophil %: 67.9 %
RBC: 4.61 10*6/uL (ref 4.40–5.90)

## 2011-12-06 LAB — FERRITIN: Ferritin (ARMC): 134 ng/mL (ref 8–388)

## 2011-12-06 LAB — IRON AND TIBC
Iron Saturation: 20 %
Iron: 66 ug/dL (ref 65–175)

## 2011-12-06 LAB — FOLATE: Folic Acid: 14.2 ng/mL (ref 3.1–100.0)

## 2011-12-23 ENCOUNTER — Ambulatory Visit: Payer: Self-pay | Admitting: Internal Medicine

## 2012-03-04 ENCOUNTER — Ambulatory Visit: Payer: Self-pay | Admitting: Internal Medicine

## 2012-03-04 LAB — CBC CANCER CENTER
Basophil #: 0.1 x10 3/mm (ref 0.0–0.1)
Basophil %: 1 %
Eosinophil #: 0.6 x10 3/mm (ref 0.0–0.7)
HCT: 42.6 % (ref 40.0–52.0)
HGB: 13.7 g/dL (ref 13.0–18.0)
MCH: 29.6 pg (ref 26.0–34.0)
MCHC: 32.2 g/dL (ref 32.0–36.0)
Monocyte #: 1 x10 3/mm (ref 0.2–1.0)
Neutrophil #: 9.4 x10 3/mm — ABNORMAL HIGH (ref 1.4–6.5)
Neutrophil %: 70.3 %
RDW: 13.7 % (ref 11.5–14.5)

## 2012-03-04 LAB — RETICULOCYTES
Absolute Retic Count: 0.0587 10*6/uL (ref 0.031–0.129)
Reticulocyte: 1.27 % (ref 0.7–2.5)

## 2012-03-24 ENCOUNTER — Ambulatory Visit: Payer: Self-pay | Admitting: Internal Medicine

## 2012-07-30 IMAGING — CR DG KNEE 1-2V*R*
2 series · 2 of 2 positions shown · non-contrast
Comparison: None

CLINICAL DATA: Injured right knee.

RIGHT KNEE - 1-2 VIEW

[view not recorded (1 of 2)]
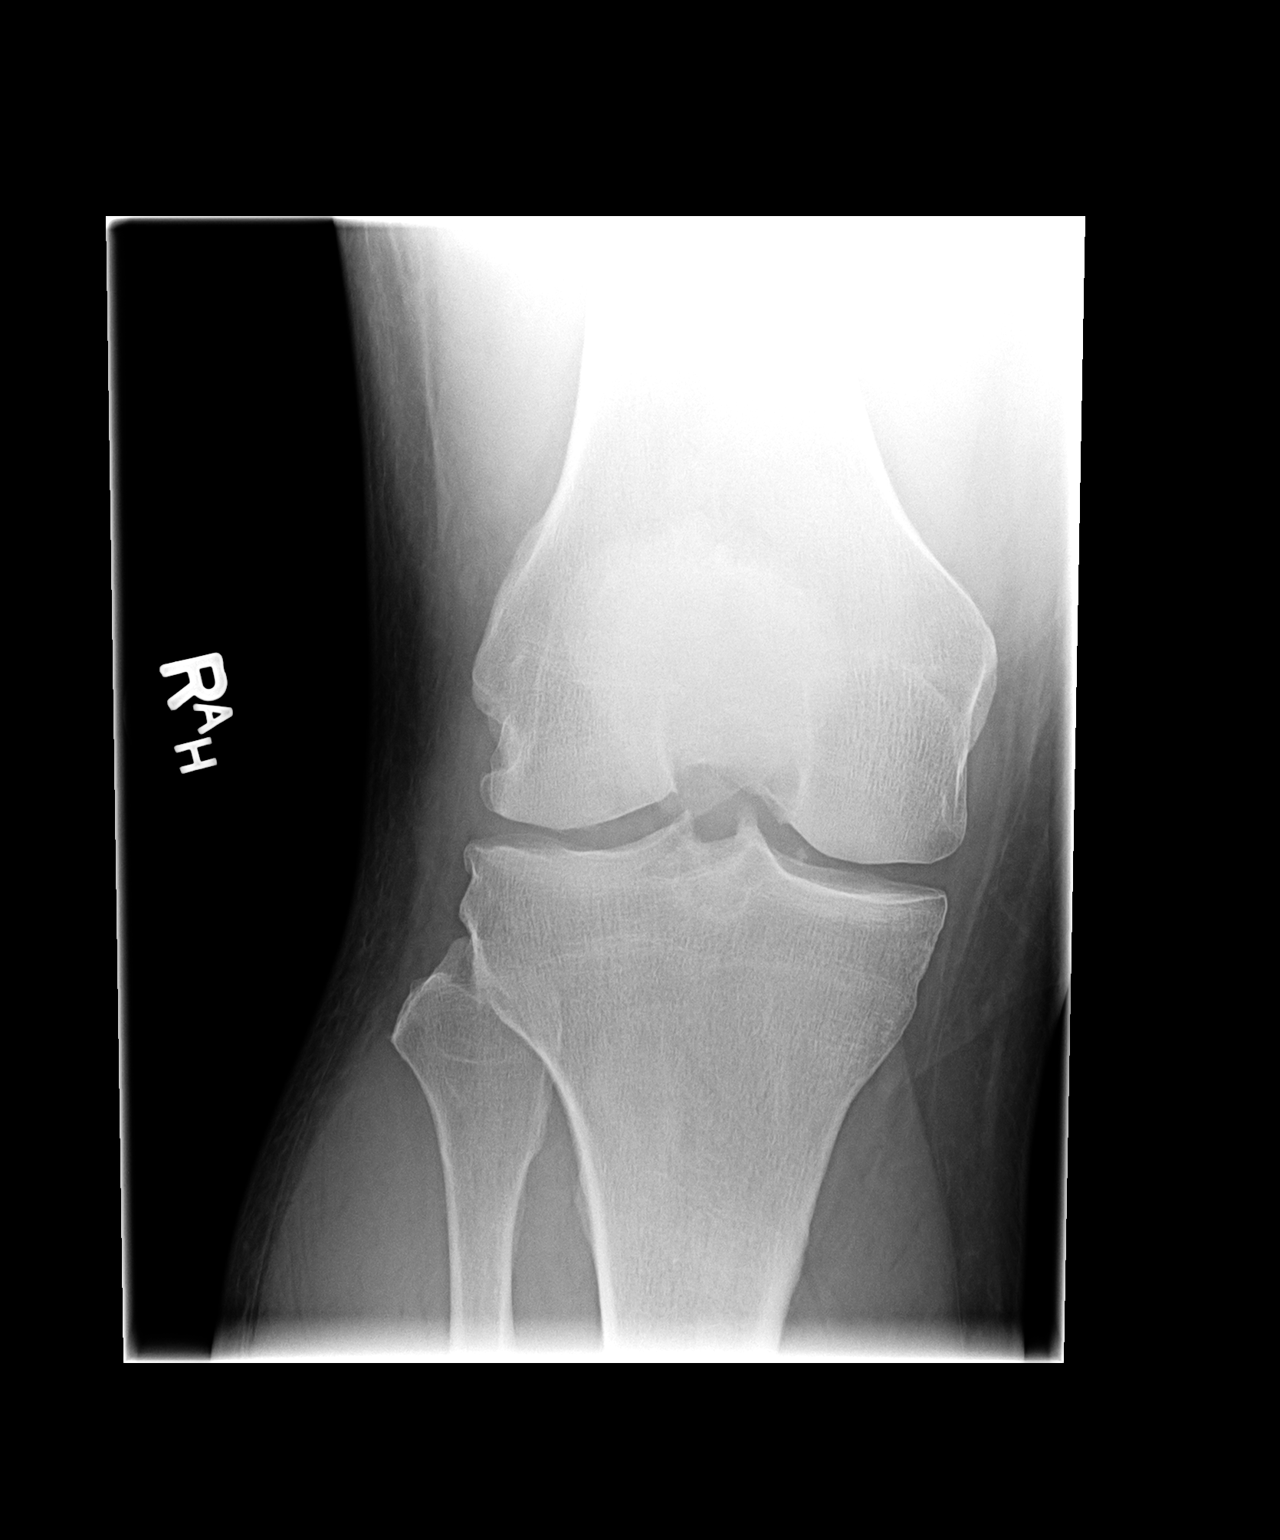

[view not recorded (2 of 2)]
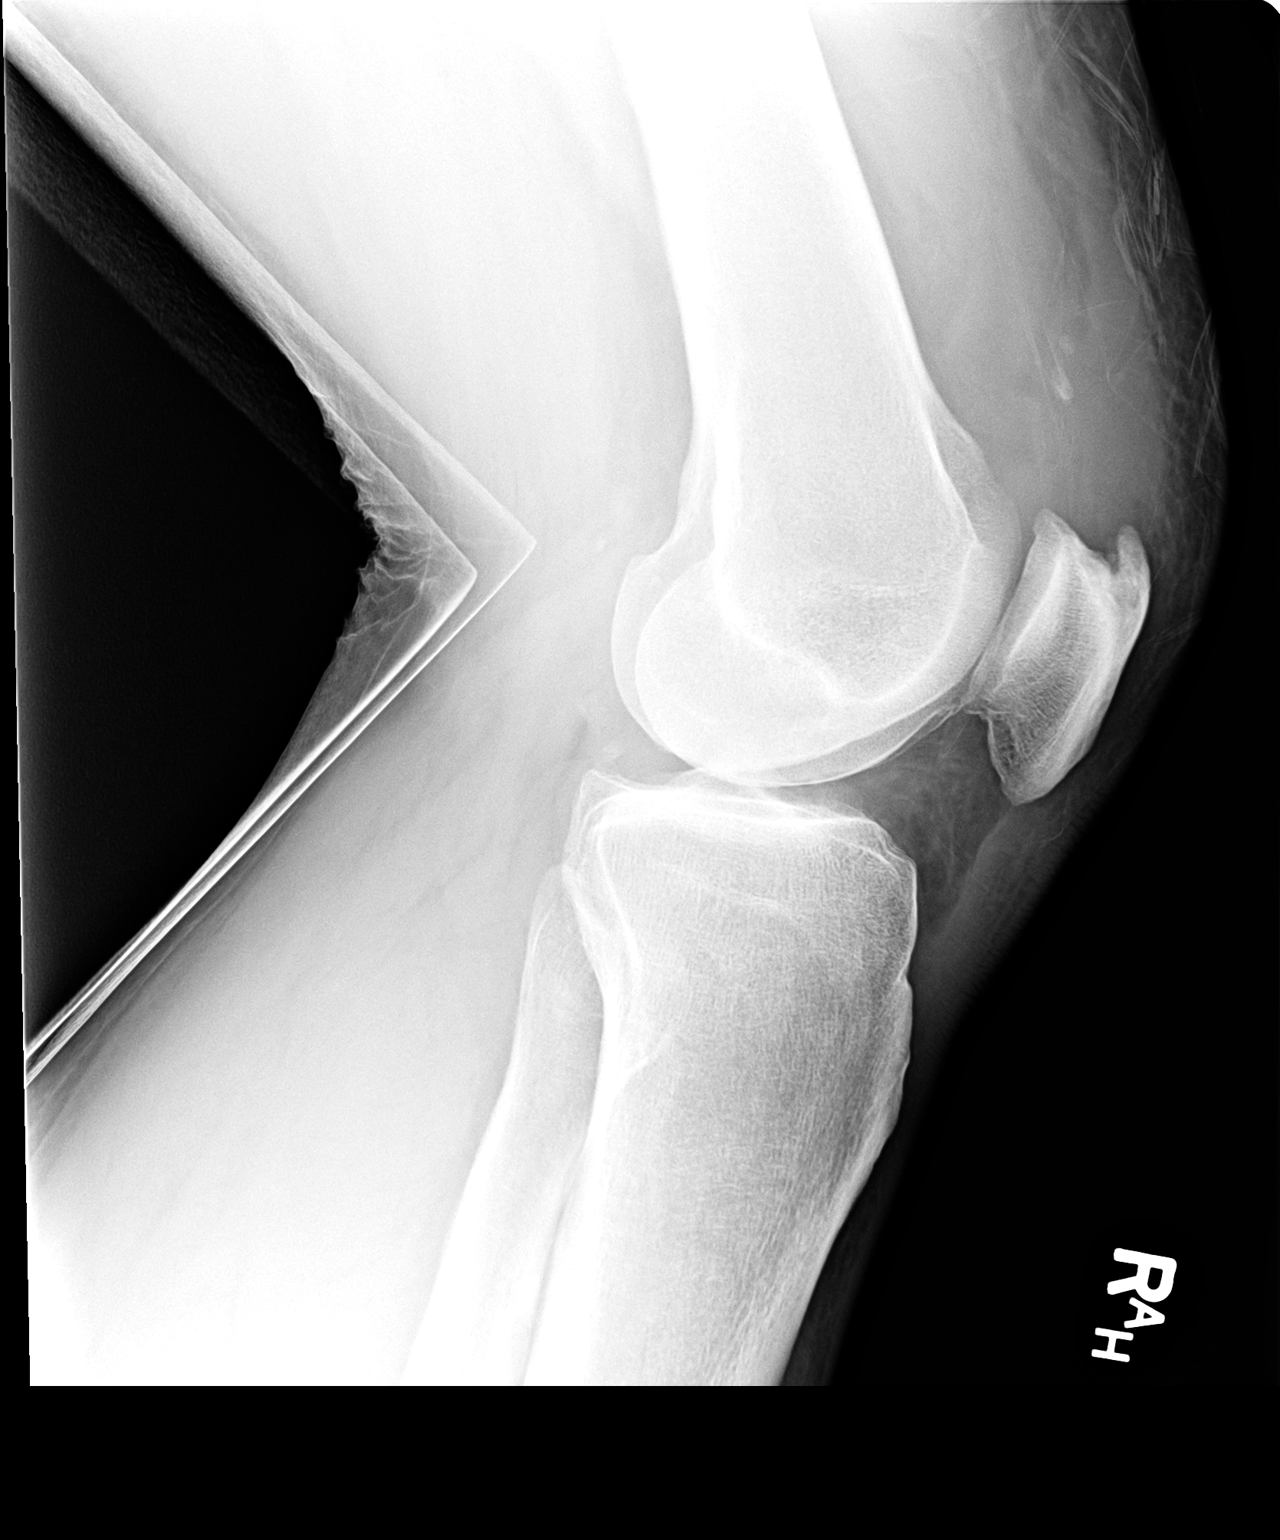

[2 of 2 positions shown; findings below may reference images not displayed]

FINDINGS: The joint spaces are fairly well maintained.  There are
mild tricompartmental degenerative changes.  No acute fracture or
osteochondral abnormality.  Findings suspicious for a quadriceps
tendon rupture with edema and fluid surrounding the upper anterior
aspect of the knee.  A joint effusion is noted.
IMPRESSION: 1.  No acute bony findings.
2.  Suspect ruptured quadriceps tendon.

## 2012-07-30 IMAGING — CR DG KNEE 1-2V*L*
2 series · 2 of 2 positions shown · non-contrast
Comparison: None.

CLINICAL DATA: Fall down steps after legs gain valve.  Pain and
swelling in the left knee.

LEFT KNEE - 1-2 VIEW

[view not recorded (1 of 2)]
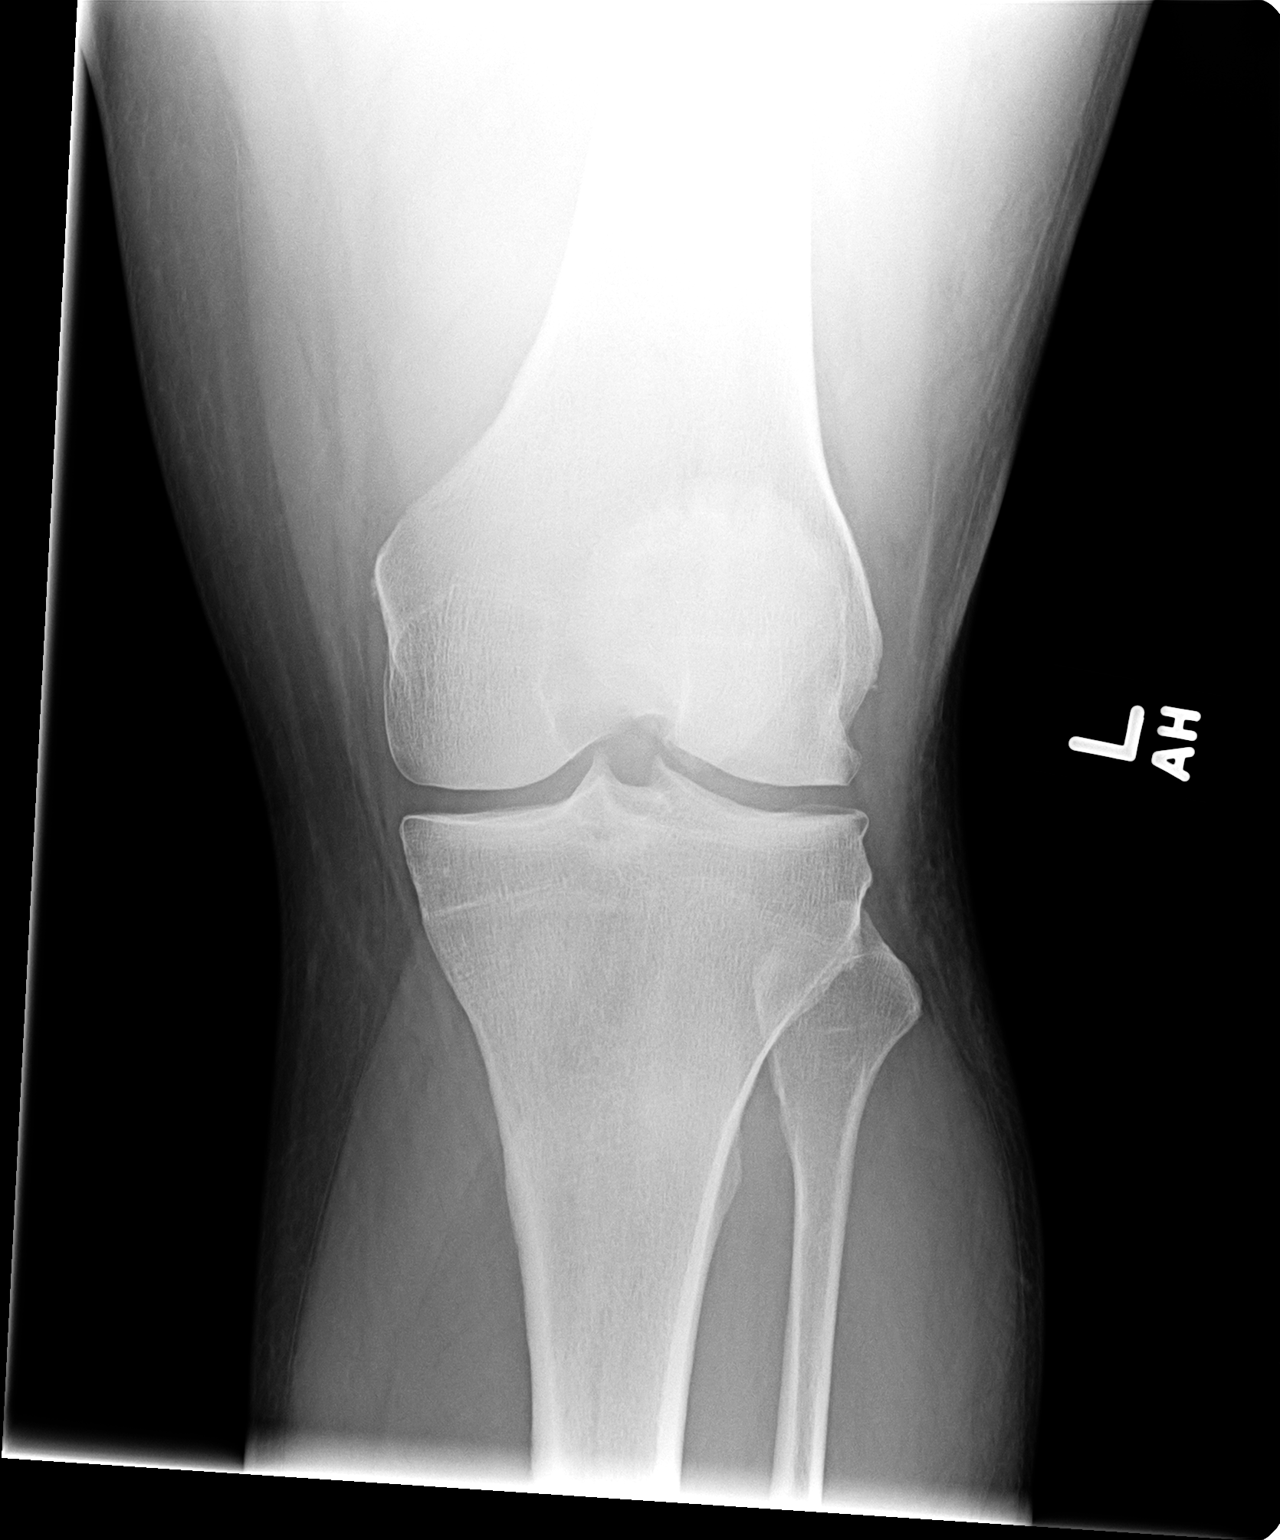

[view not recorded (2 of 2)]
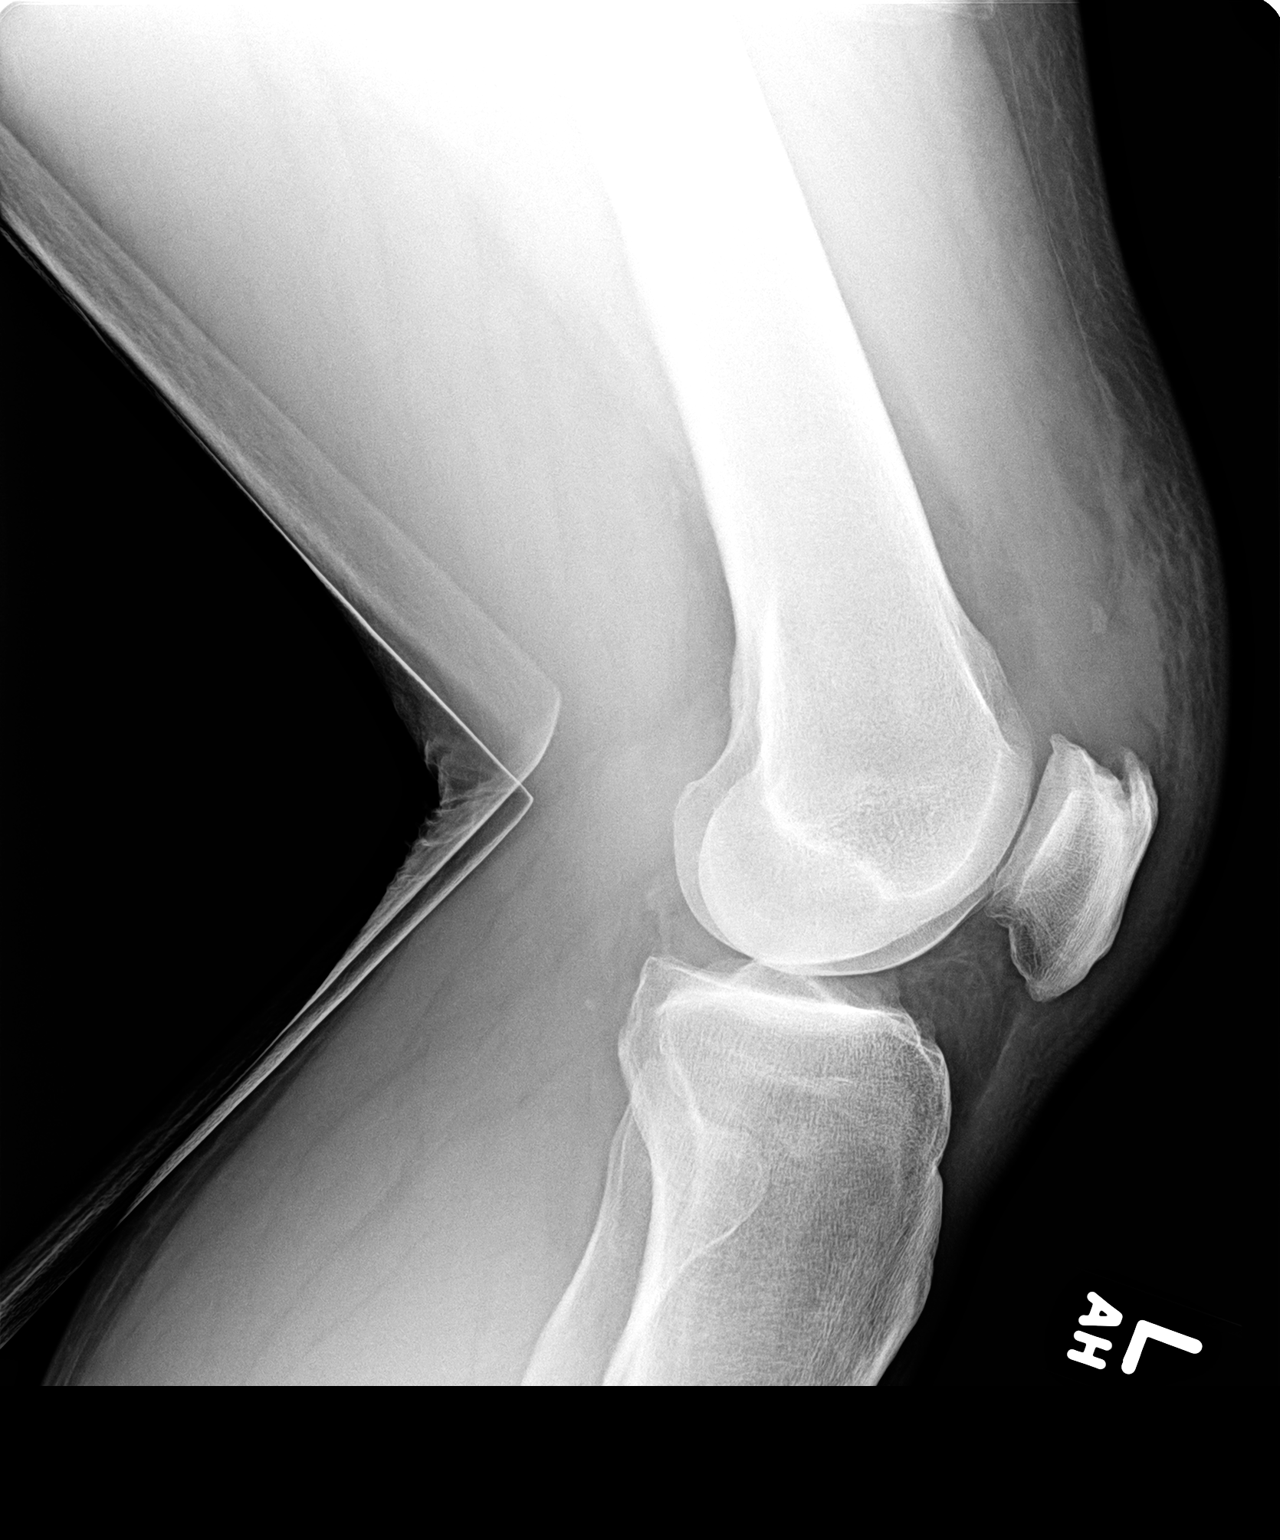

[2 of 2 positions shown; findings below may reference images not displayed]

FINDINGS: Mild degenerative narrowing of the medial and lateral
compartments.  No definite effusion.  There is infiltration and
thickening of soft tissues in the suprapatellar region with soft
tissue hematoma and small displaced bone fragment.  Changes suggest
hematoma and rupture of the quadriceps tendon insertion.  No
displaced fracture or subluxation demonstrated in the femur or
tibia.  No focal bone lesion or bone destruction.
IMPRESSION: Soft tissue thickening and hematoma with focal calcification or
bone fragment in the suprapatellar space suggesting hematoma and
rupture of the quadriceps tendon insertion.

## 2012-08-17 IMAGING — US ABDOMEN ULTRASOUND LIMITED
1 series · 17 of 25 positions shown · non-contrast
Comparison: none

REASON FOR EXAM: ATTN LIVER Hepatosplenomegaly Myeloproliferative disorder
COMMENTS:

[Series 1: abdomen ultrasound limited · 17 of 28 slices shown]
[im 1/28]
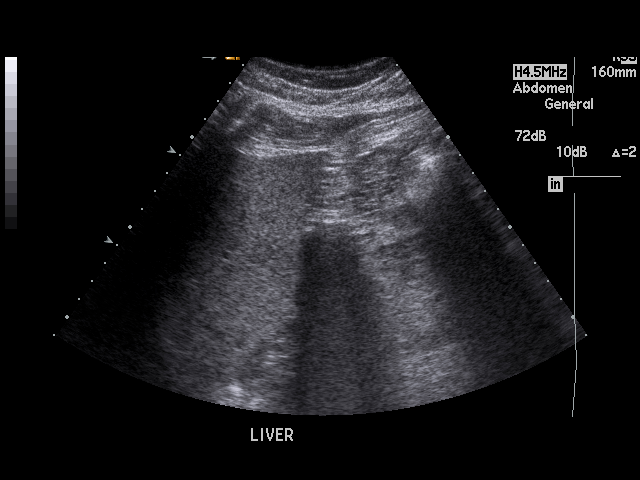
[im 3/28]
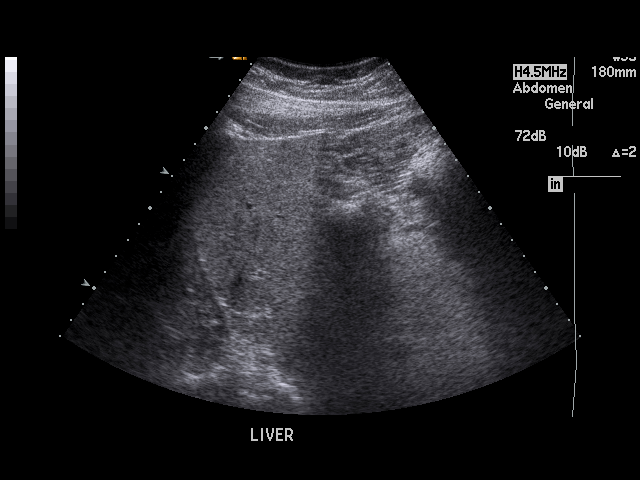
[im 4/28]
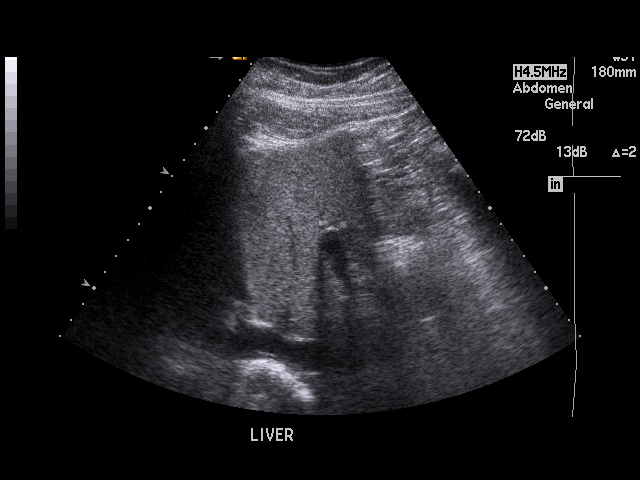
[im 6/28]
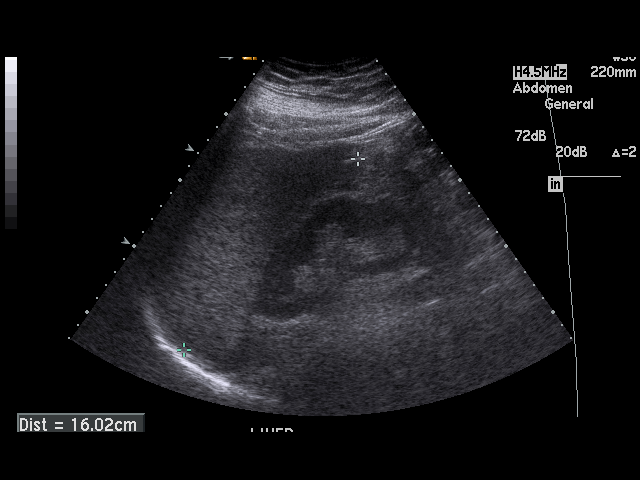
[im 7/28]
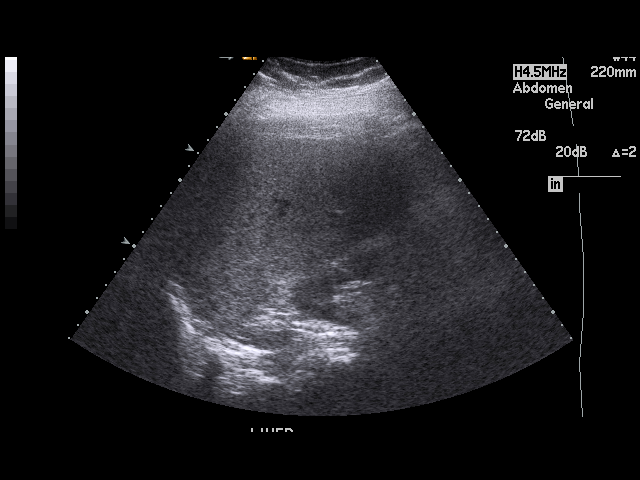
[im 10/28]
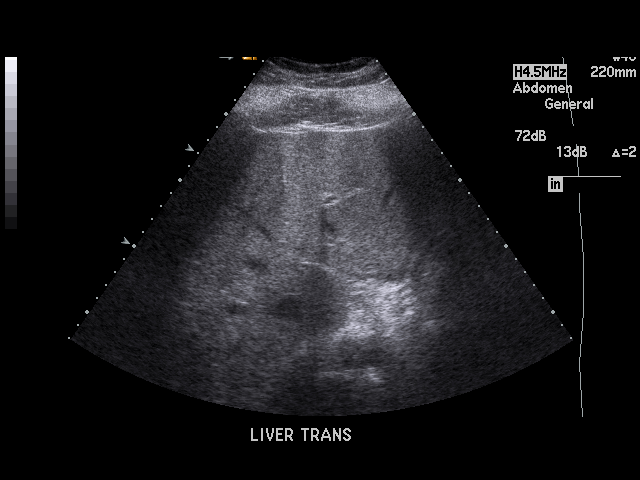
[im 11/28]
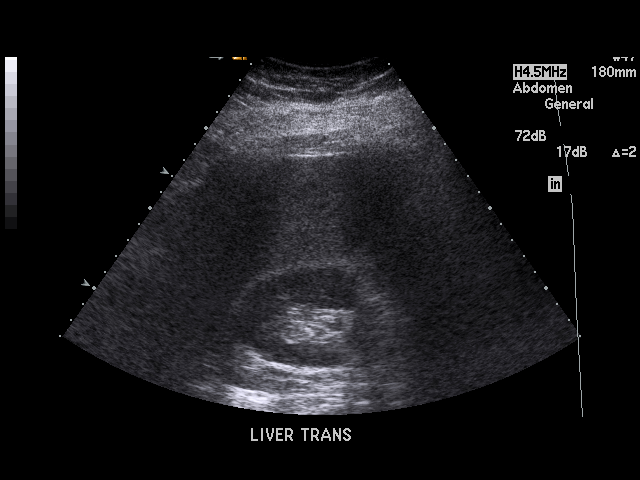
[im 13/28]
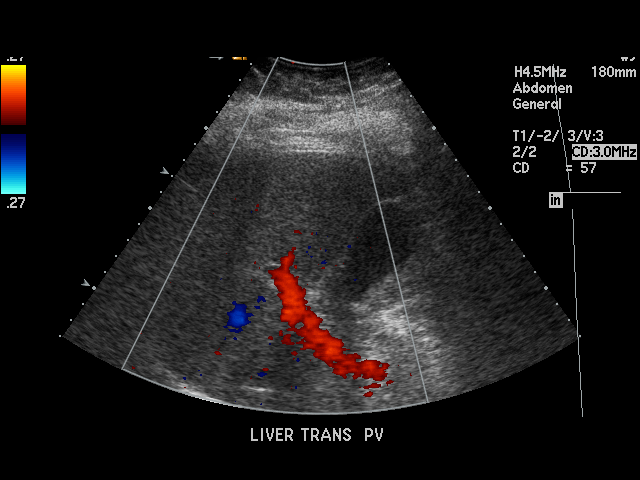
[im 14/28]
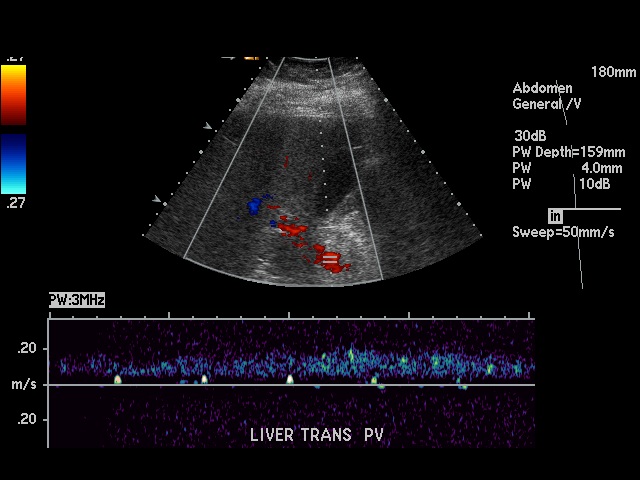
[im 15/28]
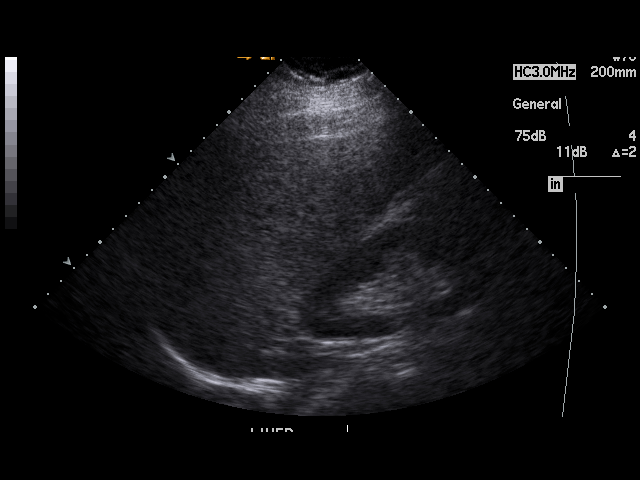
[im 17/28]
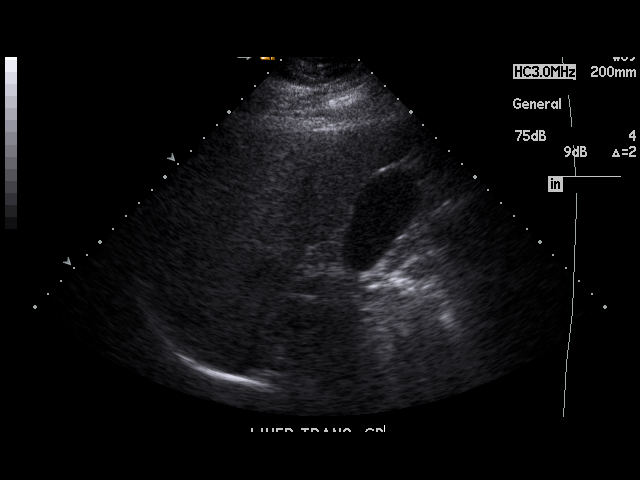
[im 19/28]
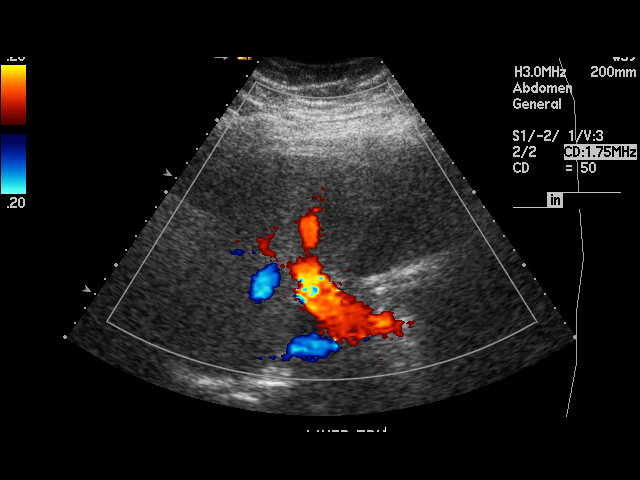
[im 21/28]
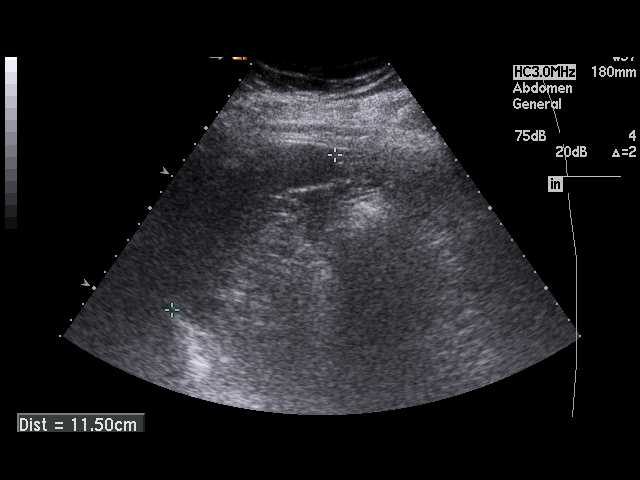
[im 22/28]
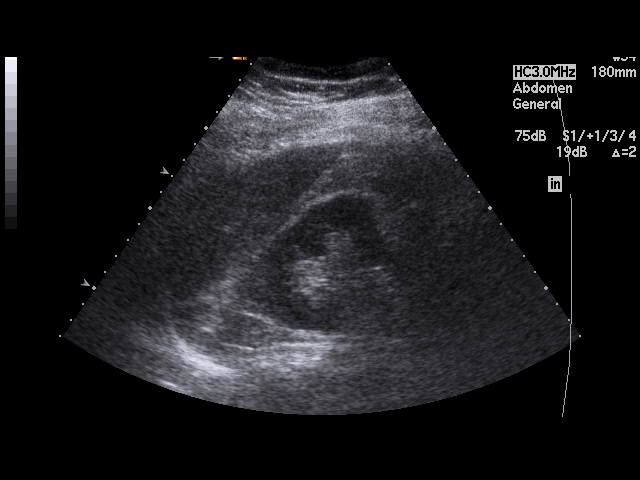
[im 24/28]
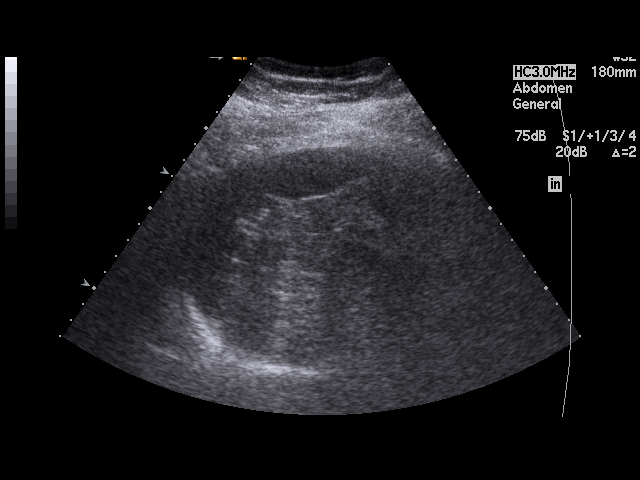
[im 25/28]
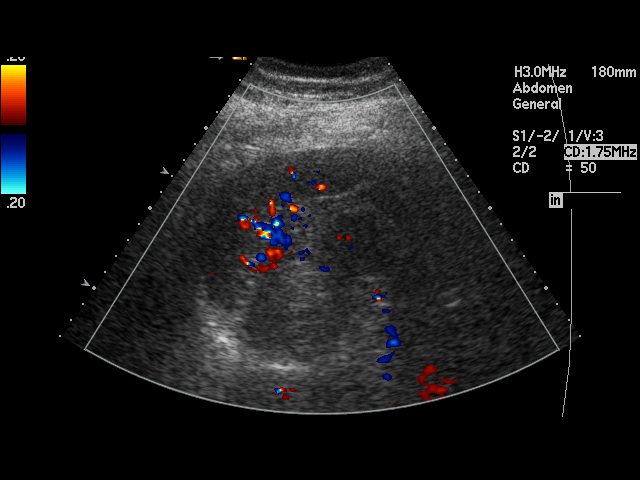
[im 28/28]
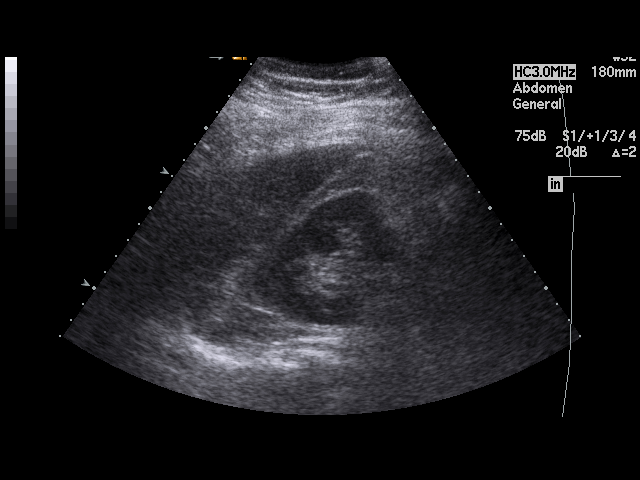

[17 of 25 positions shown; findings below may reference images not displayed]

PROCEDURE:     US  - US ABDOMEN LIMITED SURVEY  - October 11, 2011  [DATE]

RESULT:

No focal hepatic mass is seen. The liver is diffusely hyperechogenic which
suggests fatty infiltration. No dilated intrahepatic bile ducts are seen.
Portal vein flow is hepatopetal. The spleen size is normal. The spleen
measures 11.5 cm at maximum AP diameter.
IMPRESSION: Probable fatty infiltration of the liver. The liver and
spleen otherwise are normal in appearance.

## 2012-08-22 ENCOUNTER — Ambulatory Visit: Payer: Self-pay | Admitting: Internal Medicine

## 2013-03-05 ENCOUNTER — Ambulatory Visit: Payer: Self-pay | Admitting: Internal Medicine

## 2019-09-23 ENCOUNTER — Ambulatory Visit: Payer: Self-pay | Attending: Internal Medicine

## 2019-09-23 DIAGNOSIS — Z23 Encounter for immunization: Secondary | ICD-10-CM

## 2019-09-23 NOTE — Progress Notes (Signed)
   Covid-19 Vaccination Clinic  Name:  [name] [name]    MRN: [id number] DOB: 09-15-1955  09/23/2019  [name] was observed post Covid-19 immunization for 15 minutes without incident. He was provided with Vaccine Information Sheet and instruction to access the V-Safe system.   [name] was instructed to call 911 with any severe reactions post vaccine: Marland Kitchen Difficulty breathing  . Swelling of face and throat  . A fast heartbeat  . A bad rash all over body  . Dizziness and weakness   Immunizations Administered    Name Date Dose VIS Date Route   Pfizer COVID-19 Vaccine 09/23/2019  4:05 PM 0.3 mL 06/04/2019 Intramuscular   Manufacturer: [employer], [employer]   Lot: [id number]   NDC: [address]-3

## 2019-10-18 ENCOUNTER — Ambulatory Visit: Payer: Self-pay | Attending: Internal Medicine

## 2019-10-18 DIAGNOSIS — Z23 Encounter for immunization: Secondary | ICD-10-CM

## 2019-10-18 NOTE — Progress Notes (Signed)
   Covid-19 Vaccination Clinic  Name:  [name] [name]    MRN: [id number] DOB: 08-Jul-1955  10/18/2019  [name] [name] was observed post Covid-19 immunization for 15 minutes without incident. He was provided with Vaccine Information Sheet and instruction to access the V-Safe system.   [name] [name] was instructed to call 911 with any severe reactions post vaccine: Marland Kitchen Difficulty breathing  . Swelling of face and throat  . A fast heartbeat  . A bad rash all over body  . Dizziness and weakness   Immunizations Administered    Name Date Dose VIS Date Route   Pfizer COVID-19 Vaccine 10/18/2019  8:07 AM 0.3 mL 08/18/2018 Intramuscular   Manufacturer: [employer], [employer]   Lot: [id number]   NDC: [address]-1

## 2020-05-08 ENCOUNTER — Ambulatory Visit: Payer: Self-pay | Attending: Internal Medicine

## 2020-05-08 DIAGNOSIS — Z23 Encounter for immunization: Secondary | ICD-10-CM

## 2020-05-08 NOTE — Progress Notes (Signed)
   Covid-19 Vaccination Clinic  Name:  ARAF CLUGSTON    MRN: [id number] DOB: [birth year]  05/08/2020  Mr. Brusca was observed post Covid-19 immunization for 15 minutes without incident. He was provided with Vaccine Information Sheet and instruction to access the V-Safe system.   Mr. Cabello was instructed to call 911 with any severe reactions post vaccine: Marland Kitchen Difficulty breathing  . Swelling of face and throat  . A fast heartbeat  . A bad rash all over body  . Dizziness and weakness   Immunizations Administered    Name Date Dose VIS Date Route   Pfizer COVID-19 Vaccine 05/08/2020  2:33 PM 0.3 mL 04/12/2020 Intramuscular   Manufacturer: [employer], [employer]   Lot: [id number]   NDC: [address]-1

## 2021-02-08 ENCOUNTER — Encounter: Payer: Self-pay | Admitting: *Deleted

## 2021-02-09 ENCOUNTER — Encounter
Admission: RE | Disposition: A | Discharge status: Home / Self Care | Payer: Self-pay | Source: Home / Self Care | Attending: Gastroenterology

## 2021-02-09 ENCOUNTER — Ambulatory Visit: Payer: BC Managed Care – PPO | Admitting: Anesthesiology

## 2021-02-09 ENCOUNTER — Encounter: Payer: Self-pay | Admitting: *Deleted

## 2021-02-09 ENCOUNTER — Ambulatory Visit
Admission: RE | Admit: 2021-02-09 | Discharge: 2021-02-09 | Disposition: A | Discharge status: Home / Self Care | Payer: BC Managed Care – PPO | Attending: Gastroenterology | Admitting: Gastroenterology

## 2021-02-09 DIAGNOSIS — Z7901 Long term (current) use of anticoagulants: Secondary | ICD-10-CM | POA: Diagnosis not present

## 2021-02-09 DIAGNOSIS — I10 Essential (primary) hypertension: Secondary | ICD-10-CM | POA: Insufficient documentation

## 2021-02-09 DIAGNOSIS — Z79899 Other long term (current) drug therapy: Secondary | ICD-10-CM | POA: Insufficient documentation

## 2021-02-09 DIAGNOSIS — Z6837 Body mass index (BMI) 37.0-37.9, adult: Secondary | ICD-10-CM | POA: Insufficient documentation

## 2021-02-09 DIAGNOSIS — E669 Obesity, unspecified: Secondary | ICD-10-CM | POA: Insufficient documentation

## 2021-02-09 DIAGNOSIS — Z7982 Long term (current) use of aspirin: Secondary | ICD-10-CM | POA: Diagnosis not present

## 2021-02-09 DIAGNOSIS — E785 Hyperlipidemia, unspecified: Secondary | ICD-10-CM | POA: Insufficient documentation

## 2021-02-09 DIAGNOSIS — Z7984 Long term (current) use of oral hypoglycemic drugs: Secondary | ICD-10-CM | POA: Diagnosis not present

## 2021-02-09 DIAGNOSIS — Z1211 Encounter for screening for malignant neoplasm of colon: Secondary | ICD-10-CM | POA: Diagnosis present

## 2021-02-09 DIAGNOSIS — E119 Type 2 diabetes mellitus without complications: Secondary | ICD-10-CM | POA: Insufficient documentation

## 2021-02-09 HISTORY — DX: Hyperlipidemia, unspecified: E78.5

## 2021-02-09 HISTORY — PX: COLONOSCOPY WITH PROPOFOL: SHX5780

## 2021-02-09 LAB — GLUCOSE, CAPILLARY: Glucose-Capillary: 168 mg/dL — ABNORMAL HIGH (ref 70–99)

## 2021-02-09 SURGERY — COLONOSCOPY WITH PROPOFOL
Anesthesia: General

## 2021-02-09 MED ORDER — EPHEDRINE SULFATE 50 MG/ML IJ SOLN
INTRAMUSCULAR | Status: DC | PRN
  Administered 2021-02-09 (×2): 5 mg via INTRAVENOUS

## 2021-02-09 MED ORDER — PROPOFOL 500 MG/50ML IV EMUL
INTRAVENOUS | Status: DC | PRN
  Administered 2021-02-09: 175 ug/kg/min via INTRAVENOUS

## 2021-02-09 MED ORDER — PROPOFOL 500 MG/50ML IV EMUL
INTRAVENOUS | Status: AC
  Filled 2021-02-09: qty 50

## 2021-02-09 MED ORDER — LIDOCAINE HCL (CARDIAC) PF 100 MG/5ML IV SOSY
PREFILLED_SYRINGE | INTRAVENOUS | Status: DC | PRN
  Administered 2021-02-09: 50 mg via INTRAVENOUS

## 2021-02-09 MED ORDER — PROPOFOL 10 MG/ML IV BOLUS
INTRAVENOUS | Status: DC | PRN
  Administered 2021-02-09: 70 mg via INTRAVENOUS

## 2021-02-09 MED ORDER — SODIUM CHLORIDE 0.9 % IV SOLN: INTRAVENOUS | Status: DC

## 2021-02-09 MED ORDER — PHENYLEPHRINE HCL (PRESSORS) 10 MG/ML IV SOLN
INTRAVENOUS | Status: DC | PRN
  Administered 2021-02-09 (×2): 100 ug via INTRAVENOUS

## 2021-02-09 NOTE — Anesthesia Preprocedure Evaluation (Signed)
Anesthesia Evaluation  Patient identified by MRN, date of birth, ID band Patient awake    Reviewed: Allergy & Precautions, NPO status , Patient's Chart, lab work & pertinent test results  History of Anesthesia Complications Negative for: history of anesthetic complications  Airway Mallampati: III  TM Distance: <3 FB Neck ROM: limited    Dental  (+) Upper Dentures, Lower Dentures   Pulmonary neg pulmonary ROS, neg shortness of breath,    Pulmonary exam normal        Cardiovascular Exercise Tolerance: Good hypertension, (-) angina(-) Past MI Normal cardiovascular exam     Neuro/Psych negative neurological ROS  negative psych ROS   GI/Hepatic negative GI ROS, Neg liver ROS, neg GERD  ,  Endo/Other  diabetes, Type 2  Renal/GU negative Renal ROS  negative genitourinary   Musculoskeletal   Abdominal   Peds  Hematology negative hematology ROS (+)   Anesthesia Other Findings Past Medical History: No date: Diabetes mellitus No date: Hyperlipidemia No date: Hypertension  Past Surgical History: 09/23/2011: QUADRICEPS TENDON REPAIR     Comment:  Procedure: REPAIR QUADRICEP TENDON;  Surgeon: Nadara Mustard, MD;  Location: [facility] OR;  Service: Orthopedics;                Laterality: Bilateral;  Bilateral Quadriceps Tendon               Repair     Reproductive/Obstetrics negative OB ROS                             Anesthesia Physical Anesthesia Plan  ASA: 3  Anesthesia Plan: General   Post-op Pain Management:    Induction: Intravenous  PONV Risk Score and Plan: Propofol infusion and TIVA  Airway Management Planned: Natural Airway and Nasal Cannula  Additional Equipment:   Intra-op Plan:   Post-operative Plan:   Informed Consent: I have reviewed the patients History and Physical, chart, labs and discussed the procedure including the risks, benefits and alternatives for the  proposed anesthesia with the patient or authorized representative who has indicated his/her understanding and acceptance.     Dental Advisory Given  Plan Discussed with: Anesthesiologist, CRNA and Surgeon  Anesthesia Plan Comments: (Patient consented for risks of anesthesia including but not limited to:  - adverse reactions to medications - risk of airway placement if required - damage to eyes, teeth, lips or other oral mucosa - nerve damage due to positioning  - sore throat or hoarseness - Damage to heart, brain, nerves, lungs, other parts of body or loss of life  Patient voiced understanding.)        Anesthesia Quick Evaluation

## 2021-02-09 NOTE — Anesthesia Procedure Notes (Signed)
Date/Time: 02/09/2021 8:30 AM Performed by: Ginger Carne, CRNA Pre-anesthesia Checklist: Patient identified, Emergency Drugs available, Suction available, Patient being monitored and Timeout performed Patient Re-evaluated:Patient Re-evaluated prior to induction Oxygen Delivery Method: Nasal cannula Preoxygenation: Pre-oxygenation with 100% oxygen Induction Type: IV induction

## 2021-02-09 NOTE — Anesthesia Postprocedure Evaluation (Signed)
Anesthesia Post Note  Patient: [name] [name]  Procedure(s) Performed: COLONOSCOPY WITH PROPOFOL  Patient location during evaluation: Endoscopy Anesthesia Type: General Level of consciousness: awake and alert Pain management: pain level controlled Vital Signs Assessment: post-procedure vital signs reviewed and stable Respiratory status: spontaneous breathing, nonlabored ventilation, respiratory function stable and patient connected to nasal cannula oxygen Cardiovascular status: blood pressure returned to baseline and stable Postop Assessment: no apparent nausea or vomiting Anesthetic complications: no   No notable events documented.   Last Vitals:  Vitals:   02/09/21 0900 02/09/21 0903  BP:  101/60  Pulse: 84 84  Resp: 19 20  Temp:    SpO2: 95% 95%    Last Pain:  Vitals:   02/09/21 0852  TempSrc: Temporal                 Cleda Mccreedy Parisha Beaulac

## 2021-02-09 NOTE — H&P (Signed)
Outpatient short stay form Pre-procedure 02/09/2021  Regis Bill, MD  Primary Physician: Jerl Mina, MD  Reason for visit:  Screening Colonoscopy  History of present illness:   65 y/o gentleman with history of hypertension, DM II, and obesity here for screening colonoscopy. No blood thinners. No significant abdominal surgeries. No family history of GI malignancies.    Current Facility-Administered Medications:    0.9 %  sodium chloride infusion, , Intravenous, Continuous, Latavion Halls, Rossie Muskrat, MD  Medications Prior to Admission  Medication Sig Dispense Refill Last Dose   aspirin EC 325 MG tablet Take one daily as blood thinner. 30 tablet 1 Past Week   dapagliflozin propanediol (FARXIGA) 10 MG TABS tablet Take 10 mg by mouth daily.   Past Week   glimepiride (AMARYL) 4 MG tablet Take 4 mg by mouth 2 (two) times daily.   Past Week   lisinopril-hydrochlorothiazide (ZESTORETIC) 20-25 MG tablet Take 1 tablet by mouth daily.   02/08/2021   metFORMIN (GLUCOPHAGE) 1000 MG tablet Take 1,000-1,500 mg by mouth 2 (two) times daily. 1000 mg in the evening and 1500 mg at night   Past Week   simvastatin (ZOCOR) 20 MG tablet Take 20 mg by mouth every evening.   02/08/2021   hydrochlorothiazide (HYDRODIURIL) 25 MG tablet Take 1 tablet (25 mg total) by mouth daily. 30 tablet 1    iron polysaccharides (NIFEREX) 150 MG capsule Take 1 capsule (150 mg total) by mouth daily. 30 capsule 1    traZODone (DESYREL) 100 MG tablet Take 1 tablet (100 mg total) by mouth at bedtime. 30 tablet 1      No Known Allergies   Past Medical History:  Diagnosis Date   Diabetes mellitus    Hyperlipidemia    Hypertension     Review of systems:  Otherwise negative.    Physical Exam  Gen: Alert, oriented. Appears stated age.  HEENT: PERRLA. Lungs: No respiratory distress CV: RRR Abd: soft, benign, no masses Ext: No edema    Planned procedures: Proceed with colonoscopy. The patient understands the  nature of the planned procedure, indications, risks, alternatives and potential complications including but not limited to bleeding, infection, perforation, damage to internal organs and possible oversedation/side effects from anesthesia. The patient agrees and gives consent to proceed.  Please refer to procedure notes for findings, recommendations and patient disposition/instructions.     Regis Bill, MD [facility]

## 2021-02-09 NOTE — Interval H&P Note (Signed)
History and Physical Interval Note:  02/09/2021 8:23 AM  [name] [name]  has presented today for surgery, with the diagnosis of screening.  The various methods of treatment have been discussed with the patient and family. After consideration of risks, benefits and other options for treatment, the patient has consented to  Procedure(s) with comments: COLONOSCOPY WITH PROPOFOL (N/A) - DM as a surgical intervention.  The patient's history has been reviewed, patient examined, no change in status, stable for surgery.  I have reviewed the patient's chart and labs.  Questions were answered to the patient's satisfaction.     Regis Bill  Ok to proceed with colonoscopy

## 2021-02-09 NOTE — Transfer of Care (Signed)
Immediate Anesthesia Transfer of Care Note  Patient: [name] [name]  Procedure(s) Performed: COLONOSCOPY WITH PROPOFOL  Patient Location: PACU  Anesthesia Type:General  Level of Consciousness: awake, alert  and oriented  Airway & Oxygen Therapy: Patient Spontanous Breathing  Post-op Assessment: Report given to RN and Post -op Vital signs reviewed and stable  Post vital signs: Reviewed and stable  Last Vitals:  Vitals Value Taken Time  BP 97/51 02/09/21 0853  Temp    Pulse 83 02/09/21 0854  Resp 19 02/09/21 0854  SpO2 95 % 02/09/21 0854    Last Pain:  Vitals:   02/09/21 0804  TempSrc: Temporal         Complications: No notable events documented.

## 2021-02-09 NOTE — Op Note (Signed)
[facility] Patient Name: [name] [name] Procedure Date: 02/09/2021 8:28 AM MRN: [id number] Account #: [id number] Date of Birth: 1956-03-13 Admit Type: Outpatient Age: 65 Room: [facility] ENDO ROOM 3 Gender: Male Note Status: Finalized Procedure:             Colonoscopy Indications:           Screening for colorectal malignant neoplasm Providers:             Andrey Farmer MD, MD Referring MD:          Irven Easterly. Kary Kos, MD (Referring MD) Medicines:             Monitored Anesthesia Care Complications:         No immediate complications. Procedure:             Pre-Anesthesia Assessment:                        - Prior to the procedure, a History and Physical was                         performed, and patient medications and allergies were                         reviewed. The patient is competent. The risks and                         benefits of the procedure and the sedation options and                         risks were discussed with the patient. All questions                         were answered and informed consent was obtained.                         Patient identification and proposed procedure were                         verified by the physician, the nurse, the anesthetist                         and the technician in the endoscopy suite. Mental                         Status Examination: alert and oriented. Airway                         Examination: normal oropharyngeal airway and neck                         mobility. Respiratory Examination: clear to                         auscultation. CV Examination: normal. Prophylactic                         Antibiotics: The patient does not require prophylactic  antibiotics. Prior Anticoagulants: The patient has                         taken no previous anticoagulant or antiplatelet                         agents. ASA Grade Assessment: II - A patient with mild                          systemic disease. After reviewing the risks and                         benefits, the patient was deemed in satisfactory                         condition to undergo the procedure. The anesthesia                         plan was to use monitored anesthesia care (MAC).                         Immediately prior to administration of medications,                         the patient was re-assessed for adequacy to receive                         sedatives. The heart rate, respiratory rate, oxygen                         saturations, blood pressure, adequacy of pulmonary                         ventilation, and response to care were monitored                         throughout the procedure. The physical status of the                         patient was re-assessed after the procedure.                        After obtaining informed consent, the colonoscope was                         passed under direct vision. Throughout the procedure,                         the patient's blood pressure, pulse, and oxygen                         saturations were monitored continuously. The                         Colonoscope was introduced through the anus and                         advanced to the the cecum, identified by appendiceal  orifice and ileocecal valve. The colonoscopy was                         performed without difficulty. The patient tolerated                         the procedure well. The quality of the bowel                         preparation was good. Findings:      The perianal and digital rectal examinations were normal.      The entire examined colon appeared normal on direct and retroflexion       views. Impression:            - The entire examined colon is normal on direct and                         retroflexion views.                        - No specimens collected. Recommendation:        - Discharge patient to home.                        - Resume  previous diet.                        - Continue present medications.                        - Repeat colonoscopy in 10 years for screening                         purposes.                        - Return to referring physician as previously                         scheduled. Procedure Code(s):     --- Professional ---                        K9326, Colorectal cancer screening; colonoscopy on                         individual not meeting criteria for high risk Diagnosis Code(s):     --- Professional ---                        Z12.11, Encounter for screening for malignant neoplasm                         of colon CPT copyright 2019 [employer]. All rights reserved. The codes documented in this report are preliminary and upon coder review may  be revised to meet current compliance requirements. Andrey Farmer MD, MD 02/09/2021 8:49:34 AM Number of Addenda: 0 Note Initiated On: 02/09/2021 8:28 AM Scope Withdrawal Time: 0 hours 7 minutes 48 seconds  Total Procedure Duration: 0 hours 10 minutes 31 seconds  Estimated Blood Loss:  Estimated blood loss: none.  [facility]

## 2021-02-12 ENCOUNTER — Encounter: Payer: Self-pay | Admitting: Gastroenterology
